# Patient Record
Sex: Male | Born: 1960 | Race: White | Hispanic: No | Marital: Married | Smoking: Never smoker
Health system: Southern US, Community
[De-identification: ages and names within clinical notes are randomized; demographics above are authoritative.]

## PROBLEM LIST (undated history)

## (undated) DIAGNOSIS — E785 Hyperlipidemia, unspecified: Secondary | ICD-10-CM

## (undated) HISTORY — PX: NO PAST SURGERIES: SHX2092

---

## 2018-06-22 ENCOUNTER — Encounter: Payer: Self-pay | Admitting: Emergency Medicine

## 2018-06-22 ENCOUNTER — Other Ambulatory Visit: Payer: Self-pay

## 2018-06-22 ENCOUNTER — Ambulatory Visit
Admission: EM | Admit: 2018-06-22 | Discharge: 2018-06-22 | Disposition: A | Discharge status: Home / Self Care | Payer: 59 | Attending: Emergency Medicine | Admitting: Emergency Medicine

## 2018-06-22 DIAGNOSIS — J101 Influenza due to other identified influenza virus with other respiratory manifestations: Secondary | ICD-10-CM | POA: Diagnosis not present

## 2018-06-22 LAB — RAPID INFLUENZA A&B ANTIGENS
Influenza A (ARMC): POSITIVE — AB
Influenza B (ARMC): NEGATIVE

## 2018-06-22 MED ORDER — BENZONATATE 200 MG PO CAPS: 200.0000 mg | ORAL_CAPSULE | Freq: Three times a day (TID) | ORAL | 0 refills | Status: DC | PRN

## 2018-06-22 MED ORDER — OSELTAMIVIR PHOSPHATE 75 MG PO CAPS: 75.0000 mg | ORAL_CAPSULE | Freq: Two times a day (BID) | ORAL | 0 refills | Status: DC

## 2018-06-22 MED ORDER — FLUTICASONE PROPIONATE 50 MCG/ACT NA SUSP: 2.0000 | Freq: Every day | NASAL | 0 refills | Status: DC

## 2018-06-22 MED ORDER — IBUPROFEN 600 MG PO TABS: 600.0000 mg | ORAL_TABLET | Freq: Four times a day (QID) | ORAL | 0 refills | Status: DC | PRN

## 2018-06-22 MED ORDER — IBUPROFEN 800 MG PO TABS
800.0000 mg | ORAL_TABLET | Freq: Once | ORAL | Status: AC
  Administered 2018-06-22: 800 mg via ORAL

## 2018-06-22 NOTE — Discharge Instructions (Addendum)
Your flu test came back positive for influenza A.  Finish the Tamiflu, ibuprofen 600 mg combined with 1 g of Tylenol 3-4 times a day as needed, Flonase, Tessalon, push fluids. Follow up With PMD as needed, to the ER if he gets worse.

## 2018-06-22 NOTE — ED Provider Notes (Signed)
HPI  SUBJECTIVE:  Chad Welch is a 58 y.o. male who presents with body aches, postnasal drip, and dry cough starting 2 days ago.  No fevers at home.  No headaches, nasal congestion, sinus pain or pressure, sore throat.  No wheezing, chest pain, shortness of breath, neck stiffness.  No rash.  No abdominal pain.  States that he is unable to sleep at night because of the cough.  He took ibuprofen 600 mg within 4 to 6 hours of evaluation.  He has also tried NyQuil and DayQuil and improvement in his symptoms.  No aggravating factors.  He got a flu shot this year.  No known contacts with the flu.  Past medical history negative for diabetes, hypertension, pulmonary disease, smoking.  PMD: Dr. Graciela Husbands in [facility].  History reviewed. No pertinent past medical history.  History reviewed. No pertinent surgical history.  Family History  Problem Relation Age of Onset  . Cancer Mother     Social History   Tobacco Use  . Smoking status: Never Smoker  . Smokeless tobacco: Never Used  Substance Use Topics  . Alcohol use: Never    Frequency: Never  . Drug use: Never    No current facility-administered medications for this encounter.   Current Outpatient Medications:  .  benzonatate (TESSALON) 200 MG capsule, Take 1 capsule (200 mg total) by mouth 3 (three) times daily as needed for cough., Disp: 30 capsule, Rfl: 0 .  fluticasone (FLONASE) 50 MCG/ACT nasal spray, Place 2 sprays into both nostrils daily., Disp: 16 g, Rfl: 0 .  ibuprofen (ADVIL,MOTRIN) 600 MG tablet, Take 1 tablet (600 mg total) by mouth every 6 (six) hours as needed., Disp: 30 tablet, Rfl: 0 .  oseltamivir (TAMIFLU) 75 MG capsule, Take 1 capsule (75 mg total) by mouth 2 (two) times daily. X 5 days, Disp: 10 capsule, Rfl: 0  No Known Allergies   ROS  As noted in HPI.   Physical Exam  BP 122/74 (BP Location: Left Arm)   Pulse 98   Temp (!) 102.4 F (39.1 C) (Oral)   Resp 16   Ht 5\' 11"  (1.803 m)   Wt 79.4 kg   SpO2 98%   BMI  24.41 kg/m   Constitutional: Well developed, well nourished, no acute distress Eyes:  EOMI, conjunctiva normal bilaterally HENT: Normocephalic, atraumatic,mucus membranes moist.  Mild nasal congestion.  No sinus tenderness. Neck: Positive shotty cervical lymphadenopathy.  No meningismus. Respiratory: Normal inspiratory effort, lungs clear bilaterally Cardiovascular: Normal rate regular rhythm no murmurs rubs or gallops GI: nondistended skin: No rash, skin intact Musculoskeletal: no deformities Neurologic: Alert & oriented x 3, no focal neuro deficits Psychiatric: Speech and behavior appropriate   ED Course   Medications  ibuprofen (ADVIL,MOTRIN) tablet 800 mg (800 mg Oral Given 06/22/18 1303)    Orders Placed This Encounter  Procedures  . Rapid Influenza A&B Antigens ([facility] only)    Standing Status:   Standing    Number of Occurrences:   1  . Droplet precaution    Standing Status:   Standing    Number of Occurrences:   1    Results for orders placed or performed during the hospital encounter of 06/22/18 (from the past 24 hour(s))  Rapid Influenza A&B Antigens ([facility] only)     Status: Abnormal   Collection Time: 06/22/18  1:02 PM  Result Value Ref Range   Influenza A ([facility]) POSITIVE (A) NEGATIVE   Influenza B ([facility]) NEGATIVE NEGATIVE   No results  found.  ED Clinical Impression  Influenza A   ED Assessment/Plan  Flu a positive.  Home with Tamiflu, ibuprofen 600 mg combined with 1 g of Tylenol 3-4 times a day as needed, Flonase, Tessalon, push fluids. Follow up With PMD as needed, to the ER if he gets worse.  Discussed labs,  MDM, treatment plan, and plan for follow-up with patient. Discussed sn/sx that should prompt return to the ED. patient agrees with plan.   Meds ordered this encounter  Medications  . ibuprofen (ADVIL,MOTRIN) tablet 800 mg  . fluticasone (FLONASE) 50 MCG/ACT nasal spray    Sig: Place 2 sprays into both nostrils daily.    Dispense:  16 g     Refill:  0  . oseltamivir (TAMIFLU) 75 MG capsule    Sig: Take 1 capsule (75 mg total) by mouth 2 (two) times daily. X 5 days    Dispense:  10 capsule    Refill:  0  . benzonatate (TESSALON) 200 MG capsule    Sig: Take 1 capsule (200 mg total) by mouth 3 (three) times daily as needed for cough.    Dispense:  30 capsule    Refill:  0  . ibuprofen (ADVIL,MOTRIN) 600 MG tablet    Sig: Take 1 tablet (600 mg total) by mouth every 6 (six) hours as needed.    Dispense:  30 tablet    Refill:  0    *This clinic note was created using Scientist, clinical (histocompatibility and immunogenetics). Therefore, there may be occasional mistakes despite careful proofreading.   ?    Domenick Gong, MD 06/22/18 1452

## 2018-06-22 NOTE — ED Triage Notes (Signed)
Patient c/o cough, congestion, and bodyaches that started on Wed.

## 2019-05-01 ENCOUNTER — Ambulatory Visit
Admission: EM | Admit: 2019-05-01 | Discharge: 2019-05-01 | Disposition: A | Discharge status: Home / Self Care | Payer: 59 | Attending: Family Medicine | Admitting: Family Medicine

## 2019-05-01 ENCOUNTER — Other Ambulatory Visit: Payer: Self-pay

## 2019-05-01 DIAGNOSIS — Z20822 Contact with and (suspected) exposure to covid-19: Secondary | ICD-10-CM

## 2019-05-01 DIAGNOSIS — Z20828 Contact with and (suspected) exposure to other viral communicable diseases: Secondary | ICD-10-CM

## 2019-05-01 DIAGNOSIS — M791 Myalgia, unspecified site: Secondary | ICD-10-CM

## 2019-05-01 DIAGNOSIS — R509 Fever, unspecified: Secondary | ICD-10-CM

## 2019-05-01 LAB — INFLUENZA PANEL BY PCR (TYPE A & B)
Influenza A By PCR: NEGATIVE
Influenza B By PCR: NEGATIVE

## 2019-05-01 NOTE — ED Triage Notes (Signed)
Patient complains of fever and body aches that started on Monday. Patient states that he was Covid tested yesterday and it hasn't come back yet. Patient states that he thinks he has the flu.

## 2019-05-01 NOTE — Discharge Instructions (Signed)
Stay home.  Rest.  Fluids.  OTC Tylenol and Ibuprofen.  Take care  Dr. Lacinda Axon

## 2019-05-01 NOTE — ED Provider Notes (Signed)
[facility]    CSN: [id number] Arrival date & time: 05/01/19  0810  History   Chief Complaint Chief Complaint  Patient presents with  . Generalized Body Aches   HPI 58 year old male presents with fever and body aches.  Symptoms started on Monday.  Patient reports that there have been a few Covid positive individuals at his work.  Patient reports that he developed fever and body aches.  Most recent fever was earlier today.  He has taken some over-the-counter antipyretics with improvement.  Temperature currently 99.1.  Also reports mild body aches, 3/10 in severity.  Patient believes that he may have influenza.  He desires testing today.  Denies respiratory symptoms.  No known exacerbating factors.  No other reported symptoms.  No other complaints.  PMH, Surgical Hx, Family Hx, Social History reviewed and updated as below.  No significant PMH.  Past Surgical History:  Procedure Laterality Date  . NO PAST SURGERIES     Home Medications    Prior to Admission medications   Medication Sig Start Date End Date Taking? Authorizing Provider  fluticasone (FLONASE) 50 MCG/ACT nasal spray Place 2 sprays into both nostrils daily. 06/22/18 05/01/19  Melynda Ripple, MD    Family History Family History  Problem Relation Age of Onset  . Cancer Mother     Social History Social History   Tobacco Use  . Smoking status: Never Smoker  . Smokeless tobacco: Never Used  Substance Use Topics  . Alcohol use: Never  . Drug use: Never     Allergies   Patient has no known allergies.   Review of Systems Review of Systems  Constitutional: Positive for fever.  Musculoskeletal:       Body aches.   Physical Exam Triage Vital Signs ED Triage Vitals  Enc Vitals Group     BP 05/01/19 0834 119/83     Pulse Rate 05/01/19 0834 95     Resp 05/01/19 0834 17     Temp 05/01/19 0834 99.1 F (37.3 C)     Temp Source 05/01/19 0834 Oral     SpO2 05/01/19 0834 99 %     Weight  05/01/19 0833 175 lb (79.4 kg)     Height 05/01/19 0833 5' 10.5" (1.791 m)     Head Circumference --      Peak Flow --      Pain Score 05/01/19 0832 3     Pain Loc --      Pain Edu? --      Excl. in [place]? --    Updated Vital Signs BP 119/83 (BP Location: Left Arm)   Pulse 95   Temp 99.1 F (37.3 C) (Oral)   Resp 17   Ht 5' 10.5" (1.791 m)   Wt 79.4 kg   SpO2 99%   BMI 24.75 kg/m   Visual Acuity Right Eye Distance:   Left Eye Distance:   Bilateral Distance:    Right Eye Near:   Left Eye Near:    Bilateral Near:     Physical Exam Vitals and nursing note reviewed.  Constitutional:      General: He is not in acute distress.    Appearance: Normal appearance. He is not ill-appearing.  HENT:     Head: Normocephalic and atraumatic.  Eyes:     General:        Right eye: No discharge.        Left eye: No discharge.     Conjunctiva/sclera: Conjunctivae normal.  Cardiovascular:     Rate and Rhythm: Normal rate and regular rhythm.     Heart sounds: No murmur.  Pulmonary:     Effort: Pulmonary effort is normal.     Breath sounds: Normal breath sounds. No wheezing, rhonchi or rales.  Neurological:     Mental Status: He is alert.  Psychiatric:        Mood and Affect: Mood normal.        Behavior: Behavior normal.    UC Treatments / Results  Labs (all labs ordered are listed, but only abnormal results are displayed) Labs Reviewed  INFLUENZA PANEL BY PCR (TYPE A & B)    EKG   Radiology No results found.  Procedures Procedures (including critical care time)  Medications Ordered in UC Medications - No data to display  Initial Impression / Assessment and Plan / UC Course  I have reviewed the triage vital signs and the nursing notes.  Pertinent labs & imaging results that were available during my care of the patient were reviewed by me and considered in my medical decision making (see chart for details).    58 year old male presents with suspected COVID-19.   Flu negative.  Advised supportive care.  Rest, fluids, Tylenol and ibuprofen.  Awaiting patient's Covid test result from drive thru testing. Work note given.  Final Clinical Impressions(s) / UC Diagnoses   Final diagnoses:  Suspected COVID-19 virus infection     Discharge Instructions     Stay home.  Rest.  Fluids.  OTC Tylenol and Ibuprofen.  Take care  Dr. Adriana Simas     ED Prescriptions    None     PDMP not reviewed this encounter.   Everlene Other [place], [place] 05/01/19 [phone]

## 2019-05-07 ENCOUNTER — Ambulatory Visit (INDEPENDENT_AMBULATORY_CARE_PROVIDER_SITE_OTHER)
Admission: EM | Admit: 2019-05-07 | Discharge: 2019-05-07 | Disposition: A | Discharge status: Other Acute Inpatient Hospital | Payer: 59 | Source: Home / Self Care | Attending: Family Medicine | Admitting: Family Medicine

## 2019-05-07 ENCOUNTER — Emergency Department: Payer: 59

## 2019-05-07 ENCOUNTER — Inpatient Hospital Stay
Admission: EM | Admit: 2019-05-07 | Discharge: 2019-05-10 | DRG: 177 | Disposition: A | Discharge status: Other Acute Inpatient Hospital | Payer: 59 | Attending: Internal Medicine | Admitting: Internal Medicine

## 2019-05-07 ENCOUNTER — Inpatient Hospital Stay: Payer: 59

## 2019-05-07 ENCOUNTER — Other Ambulatory Visit: Payer: Self-pay

## 2019-05-07 DIAGNOSIS — Z79899 Other long term (current) drug therapy: Secondary | ICD-10-CM | POA: Diagnosis not present

## 2019-05-07 DIAGNOSIS — U071 COVID-19: Principal | ICD-10-CM | POA: Diagnosis present

## 2019-05-07 DIAGNOSIS — J1282 Pneumonia due to coronavirus disease 2019: Secondary | ICD-10-CM | POA: Diagnosis present

## 2019-05-07 DIAGNOSIS — D72810 Lymphocytopenia: Secondary | ICD-10-CM | POA: Diagnosis present

## 2019-05-07 DIAGNOSIS — Z9981 Dependence on supplemental oxygen: Secondary | ICD-10-CM | POA: Diagnosis not present

## 2019-05-07 DIAGNOSIS — J9601 Acute respiratory failure with hypoxia: Secondary | ICD-10-CM | POA: Diagnosis present

## 2019-05-07 DIAGNOSIS — Z809 Family history of malignant neoplasm, unspecified: Secondary | ICD-10-CM

## 2019-05-07 DIAGNOSIS — K219 Gastro-esophageal reflux disease without esophagitis: Secondary | ICD-10-CM | POA: Diagnosis present

## 2019-05-07 LAB — COMPREHENSIVE METABOLIC PANEL
ALT: 106 U/L — ABNORMAL HIGH (ref 0–44)
AST: 74 U/L — ABNORMAL HIGH (ref 15–41)
Albumin: 3.4 g/dL — ABNORMAL LOW (ref 3.5–5.0)
Alkaline Phosphatase: 239 U/L — ABNORMAL HIGH (ref 38–126)
Anion gap: 10 (ref 5–15)
BUN: 19 mg/dL (ref 6–20)
CO2: 27 mmol/L (ref 22–32)
Calcium: 8.8 mg/dL — ABNORMAL LOW (ref 8.9–10.3)
Chloride: 98 mmol/L (ref 98–111)
Creatinine, Ser: 0.94 mg/dL (ref 0.61–1.24)
GFR calc Af Amer: >60 mL/min (ref >60)
GFR calc non Af Amer: >60 mL/min (ref >60)
Glucose, Bld: 146 mg/dL — ABNORMAL HIGH (ref 70–99)
Potassium: 4 mmol/L (ref 3.5–5.1)
Sodium: 135 mmol/L (ref 135–145)
Total Bilirubin: 1.1 mg/dL (ref 0.3–1.2)
Total Protein: 7.8 g/dL (ref 6.5–8.1)

## 2019-05-07 LAB — CBC WITH DIFFERENTIAL/PLATELET
Abs Immature Granulocytes: 0.15 10*3/uL — ABNORMAL HIGH (ref 0.00–0.07)
Basophils Absolute: 0 10*3/uL (ref 0.0–0.1)
Basophils Relative: 0 %
Eosinophils Absolute: 0 10*3/uL (ref 0.0–0.5)
Eosinophils Relative: 0 %
HCT: 40.8 % (ref 39.0–52.0)
Hemoglobin: 13.3 g/dL (ref 13.0–17.0)
Immature Granulocytes: 1 %
Lymphocytes Relative: 3 %
Lymphs Abs: 0.4 10*3/uL — ABNORMAL LOW (ref 0.7–4.0)
MCH: 29.6 pg (ref 26.0–34.0)
MCHC: 32.6 g/dL (ref 30.0–36.0)
MCV: 90.7 fL (ref 80.0–100.0)
Monocytes Absolute: 0.3 10*3/uL (ref 0.1–1.0)
Monocytes Relative: 2 %
Neutro Abs: 13.9 10*3/uL — ABNORMAL HIGH (ref 1.7–7.7)
Neutrophils Relative %: 94 %
Platelets: 200 10*3/uL (ref 150–400)
RBC: 4.5 MIL/uL (ref 4.22–5.81)
RDW: 12.4 % (ref 11.5–15.5)
WBC: 14.8 10*3/uL — ABNORMAL HIGH (ref 4.0–10.5)
nRBC: 0 % (ref 0.0–0.2)

## 2019-05-07 LAB — CBC
HCT: 40.4 % (ref 39.0–52.0)
Hemoglobin: 13.4 g/dL (ref 13.0–17.0)
MCH: 29.8 pg (ref 26.0–34.0)
MCHC: 33.2 g/dL (ref 30.0–36.0)
MCV: 90 fL (ref 80.0–100.0)
Platelets: 202 10*3/uL (ref 150–400)
RBC: 4.49 MIL/uL (ref 4.22–5.81)
RDW: 12.5 % (ref 11.5–15.5)
WBC: 15.7 10*3/uL — ABNORMAL HIGH (ref 4.0–10.5)
nRBC: 0 % (ref 0.0–0.2)

## 2019-05-07 LAB — CREATININE, SERUM
Creatinine, Ser: 0.97 mg/dL (ref 0.61–1.24)
GFR calc Af Amer: >60 mL/min (ref >60)
GFR calc non Af Amer: >60 mL/min (ref >60)

## 2019-05-07 LAB — FERRITIN: Ferritin: 679 ng/mL — ABNORMAL HIGH (ref 24–336)

## 2019-05-07 LAB — TROPONIN I (HIGH SENSITIVITY)
Troponin I (High Sensitivity): 5 ng/L (ref <18)
Troponin I (High Sensitivity): 5 ng/L (ref <18)

## 2019-05-07 LAB — PROCALCITONIN
Procalcitonin: 0.5 ng/mL
Procalcitonin: 1.06 ng/mL

## 2019-05-07 LAB — FIBRIN DERIVATIVES D-DIMER (ARMC ONLY): Fibrin derivatives D-dimer (ARMC): 937.54 ng/mL (FEU) — ABNORMAL HIGH (ref 0.00–499.00)

## 2019-05-07 LAB — HIV ANTIBODY (ROUTINE TESTING W REFLEX): HIV Screen 4th Generation wRfx: NONREACTIVE

## 2019-05-07 LAB — LACTATE DEHYDROGENASE: LDH: 276 U/L — ABNORMAL HIGH (ref 98–192)

## 2019-05-07 LAB — ABO/RH: ABO/RH(D): A NEG

## 2019-05-07 LAB — BRAIN NATRIURETIC PEPTIDE: B Natriuretic Peptide: 237 pg/mL — ABNORMAL HIGH (ref 0.0–100.0)

## 2019-05-07 LAB — LACTIC ACID, PLASMA
Lactic Acid, Venous: 2.3 mmol/L (ref 0.5–1.9)
Lactic Acid, Venous: 1.6 mmol/L (ref 0.5–1.9)

## 2019-05-07 MED ORDER — DEXAMETHASONE SODIUM PHOSPHATE 10 MG/ML IJ SOLN
8.0000 mg | Freq: Once | INTRAMUSCULAR | Status: AC
  Administered 2019-05-07: 14:00:00 8 mg via INTRAVENOUS
  Filled 2019-05-07: qty 1

## 2019-05-07 MED ORDER — ACETAMINOPHEN 500 MG PO TABS
500.0000 mg | ORAL_TABLET | Freq: Four times a day (QID) | ORAL | Status: DC | PRN
  Administered 2019-05-08: 08:00:00 500 mg via ORAL
  Administered 2019-05-08 – 2019-05-09 (×3): 1000 mg via ORAL
  Filled 2019-05-07 (×3): qty 2

## 2019-05-07 MED ORDER — SODIUM CHLORIDE 0.9 % IV SOLN
200.0000 mg | Freq: Once | INTRAVENOUS | Status: AC
  Administered 2019-05-07: 200 mg via INTRAVENOUS
  Filled 2019-05-07: qty 200

## 2019-05-07 MED ORDER — THIAMINE HCL 100 MG PO TABS
100.0000 mg | ORAL_TABLET | Freq: Every day | ORAL | Status: DC
  Administered 2019-05-07 – 2019-05-10 (×4): 100 mg via ORAL
  Filled 2019-05-07 (×4): qty 1

## 2019-05-07 MED ORDER — IOHEXOL 350 MG/ML SOLN
75.0000 mL | Freq: Once | INTRAVENOUS | Status: AC | PRN
  Administered 2019-05-07: 19:00:00 75 mL via INTRAVENOUS

## 2019-05-07 MED ORDER — DEXAMETHASONE SODIUM PHOSPHATE 10 MG/ML IJ SOLN
6.0000 mg | INTRAMUSCULAR | Status: DC
  Administered 2019-05-07 – 2019-05-09 (×3): 6 mg via INTRAVENOUS
  Filled 2019-05-07: qty 1
  Filled 2019-05-07 (×3): qty 0.6

## 2019-05-07 MED ORDER — ACETAMINOPHEN 325 MG PO TABS
650.0000 mg | ORAL_TABLET | Freq: Once | ORAL | Status: AC
  Administered 2019-05-07: 14:00:00 650 mg via ORAL
  Filled 2019-05-07: qty 2

## 2019-05-07 MED ORDER — ADULT MULTIVITAMIN W/MINERALS CH
1.0000 | ORAL_TABLET | Freq: Every day | ORAL | Status: DC
  Administered 2019-05-07 – 2019-05-10 (×4): 1 via ORAL
  Filled 2019-05-07 (×4): qty 1

## 2019-05-07 MED ORDER — ENOXAPARIN SODIUM 40 MG/0.4ML ~~LOC~~ SOLN
40.0000 mg | SUBCUTANEOUS | Status: DC
  Administered 2019-05-07 – 2019-05-09 (×3): 40 mg via SUBCUTANEOUS
  Filled 2019-05-07 (×3): qty 0.4

## 2019-05-07 MED ORDER — BUTALBITAL-APAP-CAFFEINE 50-325-40 MG PO TABS
1.0000 | ORAL_TABLET | ORAL | Status: DC | PRN
  Administered 2019-05-07 – 2019-05-10 (×3): 1 via ORAL
  Filled 2019-05-07 (×3): qty 1

## 2019-05-07 MED ORDER — OXYCODONE HCL 5 MG PO TABS: 5.0000 mg | ORAL_TABLET | ORAL | Status: DC | PRN

## 2019-05-07 MED ORDER — SENNOSIDES-DOCUSATE SODIUM 8.6-50 MG PO TABS: 1.0000 | ORAL_TABLET | Freq: Every evening | ORAL | Status: DC | PRN

## 2019-05-07 MED ORDER — ACETAMINOPHEN 325 MG PO TABS
650.0000 mg | ORAL_TABLET | Freq: Four times a day (QID) | ORAL | Status: DC | PRN
  Filled 2019-05-07: qty 2

## 2019-05-07 MED ORDER — SODIUM CHLORIDE 0.9 % IV SOLN
100.0000 mg | Freq: Every day | INTRAVENOUS | Status: DC
  Administered 2019-05-08 – 2019-05-10 (×3): 100 mg via INTRAVENOUS
  Filled 2019-05-07: qty 20
  Filled 2019-05-07 (×3): qty 100

## 2019-05-07 MED ORDER — FOLIC ACID 1 MG PO TABS
1.0000 mg | ORAL_TABLET | Freq: Every day | ORAL | Status: DC
  Administered 2019-05-07 – 2019-05-10 (×4): 1 mg via ORAL
  Filled 2019-05-07 (×4): qty 1

## 2019-05-07 NOTE — ED Notes (Signed)
Pt asking for food; no sandwich trays available at this time.  Pt provided graham crackers and peanut butter instead.

## 2019-05-07 NOTE — ED Notes (Signed)
Pt given meal tray. Pt unhooked from cardiac monitor and ambulatory to bathroom.

## 2019-05-07 NOTE — ED Provider Notes (Signed)
[facility]    CSN: [id number] Arrival date & time: 05/07/19  1048      History   Chief Complaint Chief Complaint  Patient presents with  . Cough   HPI  58 year old male who recently tested positive for Covid presents with cough and shortness of breath.  Patient reports ongoing fever and chills.  He has had some mild body aches.  Now presenting with shortness of breath.  Also reports productive cough.  Cough is worse with exhalation.  Currently afebrile at 99.3.  He has been taking Tylenol.  Patient hypoxic on arrival.  No other medications or interventions tried.  No other complaints.  PMH, Surgical Hx, Family Hx, Social History reviewed and updated as below.  No significant PMH.  Past Surgical History:  Procedure Laterality Date  . NO PAST SURGERIES       Home Medications    Prior to Admission medications   Medication Sig Start Date End Date Taking? Authorizing Provider  acetaminophen (TYLENOL) 500 MG tablet Take 500 mg by mouth every 6 (six) hours as needed.   Yes [provider]  fluticasone (FLONASE) 50 MCG/ACT nasal spray Place 2 sprays into both nostrils daily. 06/22/18 05/01/19  Domenick Gong, MD    Family History Family History  Problem Relation Age of Onset  . Cancer Mother     Social History Social History   Tobacco Use  . Smoking status: Never Smoker  . Smokeless tobacco: Never Used  Substance Use Topics  . Alcohol use: Never  . Drug use: Never     Allergies   Patient has no known allergies.   Review of Systems Review of Systems  Constitutional: Positive for chills and fever.  Respiratory: Positive for cough and shortness of breath.   Musculoskeletal:       Body aches.   Physical Exam Triage Vital Signs ED Triage Vitals  Enc Vitals Group     BP 05/07/19 1108 123/62     Pulse Rate 05/07/19 1108 94     Resp --      Temp 05/07/19 1108 99.3 F (37.4 C)     Temp Source 05/07/19 1108 Oral     SpO2 05/07/19  1108 91 %     Weight 05/07/19 1106 175 lb (79.4 kg)     Height 05/07/19 1106 5' 10.5" (1.791 m)     Head Circumference --      Peak Flow --      Pain Score 05/07/19 1105 2     Pain Loc --      Pain Edu? --      Excl. in [place]? --     Updated Vital Signs BP 123/62 (BP Location: Left Arm)   Pulse 94   Temp 99.3 F (37.4 C) (Oral)   Ht 5' 10.5" (1.791 m)   Wt 79.4 kg   SpO2 91%   BMI 24.75 kg/m   Visual Acuity Right Eye Distance:   Left Eye Distance:   Bilateral Distance:    Right Eye Near:   Left Eye Near:    Bilateral Near:     Physical Exam Constitutional:      Appearance: He is not ill-appearing or diaphoretic.     Comments: Mildly dyspneic.    HENT:     Head: Normocephalic and atraumatic.  Eyes:     General:        Right eye: No discharge.        Left eye: No discharge.  Conjunctiva/sclera: Conjunctivae normal.  Cardiovascular:     Rate and Rhythm: Regular rhythm. Tachycardia present.     Heart sounds: No murmur.  Pulmonary:     Breath sounds: No wheezing.     Comments: Mild increased work of breathing.  No appreciable adventitious breath sounds. Skin:    General: Skin is warm.     Findings: No rash.  Neurological:     Mental Status: He is alert.  Psychiatric:        Mood and Affect: Mood normal.        Behavior: Behavior normal.    UC Treatments / Results  Labs (all labs ordered are listed, but only abnormal results are displayed) Labs Reviewed - No data to display  EKG   Radiology No results found.  Procedures Procedures (including critical care time)  Medications Ordered in UC Medications - No data to display  Initial Impression / Assessment and Plan / UC Course  I have reviewed the triage vital signs and the nursing notes.  Pertinent labs & imaging results that were available during my care of the patient were reviewed by me and considered in my medical decision making (see chart for details).    58 year old male who is Covid  positive presents with hypoxia.  Placed on 2 L of oxygen.  Sending to the hospital via EMS.  Charge nurse notified.  Final Clinical Impressions(s) / UC Diagnoses   Final diagnoses:  Acute respiratory failure with hypoxia ([place])  COVID-19   Discharge Instructions   None    ED Prescriptions    None     PDMP not reviewed this encounter.   Coral Spikes, DO 05/07/19 1200

## 2019-05-07 NOTE — ED Notes (Signed)
Provider Ouma contacted about pt's continued HA and request for meds. Also notified pt would like wife called and updated by provider specifically.

## 2019-05-07 NOTE — ED Provider Notes (Signed)
[facility] Emergency Department Provider Note   ____________________________________________    I have reviewed the triage vital signs and the nursing notes.   HISTORY  Chief Complaint Shortness of Breath     HPI Chad Welch is a 58 y.o. male who presents with complaints of shortness of breath, fatigue, myalgias, chills, cough.  Patient tested positive for novel coronavirus 1 week ago.  He reports he started having body aches the day prior to being tested.  Was notified several days ago of positive test result.  Went to urgent care today because of cough and shortness of breath.  Sent to the emergency department because of hypoxia.  Has been taking Tylenol for chills and fever.  History reviewed. No pertinent past medical history.  There are no problems to display for this patient.   Past Surgical History:  Procedure Laterality Date  . NO PAST SURGERIES      Prior to Admission medications   Medication Sig Start Date End Date Taking? Authorizing Provider  acetaminophen (TYLENOL) 500 MG tablet Take 500 mg by mouth every 6 (six) hours as needed.    [provider]  fluticasone (FLONASE) 50 MCG/ACT nasal spray Place 2 sprays into both nostrils daily. 06/22/18 05/01/19  Melynda Ripple, MD     Allergies Patient has no known allergies.  Family History  Problem Relation Age of Onset  . Cancer Mother     Social History Social History   Tobacco Use  . Smoking status: Never Smoker  . Smokeless tobacco: Never Used  Substance Use Topics  . Alcohol use: Never  . Drug use: Never    Review of Systems  Constitutional: Positive chills Eyes: No visual changes.  ENT: Mild sore throat Cardiovascular: No chest pain Respiratory: As above. Gastrointestinal: No abdominal pain.  No nausea, no vomiting.   Genitourinary: Negative for dysuria. Musculoskeletal: Positive myalgias Skin: Negative for rash. Neurological: Negative for headaches      ____________________________________________   PHYSICAL EXAM:  VITAL SIGNS: ED Triage Vitals  Enc Vitals Group     BP 05/07/19 1229 (!) 141/69     Pulse Rate 05/07/19 1229 (!) 108     Resp 05/07/19 1229 18     Temp 05/07/19 1229 99.6 F (37.6 C)     Temp Source 05/07/19 1229 Oral     SpO2 05/07/19 1229 94 %     Weight 05/07/19 1230 79.4 kg (175 lb)     Height 05/07/19 1230 1.778 m (5\' 10" )     Head Circumference --      Peak Flow --      Pain Score 05/07/19 1229 3     Pain Loc --      Pain Edu? --      Excl. in [place]? --     Constitutional: Alert and oriented. Eyes: Conjunctivae are normal.  Head: Atraumatic. Nose: No congestion/rhinnorhea. Mouth/Throat: Mucous membranes are moist.   Neck:  Painless ROM Cardiovascular: Normal rate, regular rhythm.  Good peripheral circulation. Respiratory: Normal respiratory effort.  No retractions.  Musculoskeletal: No lower extremity tenderness nor edema.  Warm and well perfused Neurologic:  Normal speech and language. No gross focal neurologic deficits are appreciated.  Skin:  Skin is warm, dry and intact. No rash noted. Psychiatric: Mood and affect are normal. Speech and behavior are normal.  ____________________________________________   LABS (all labs ordered are listed, but only abnormal results are displayed)  Labs Reviewed  LACTIC ACID, PLASMA - Abnormal; Notable  for the following components:      Result Value   Lactic Acid, Venous 2.3 (*)    All other components within normal limits  CBC WITH DIFFERENTIAL/PLATELET - Abnormal; Notable for the following components:   WBC 14.8 (*)    Neutro Abs 13.9 (*)    Lymphs Abs 0.4 (*)    Abs Immature Granulocytes 0.15 (*)    All other components within normal limits  COMPREHENSIVE METABOLIC PANEL - Abnormal; Notable for the following components:   Glucose, Bld 146 (*)    Calcium 8.8 (*)    Albumin 3.4 (*)    AST 74 (*)    ALT 106 (*)    Alkaline Phosphatase 239 (*)    All  other components within normal limits  FIBRIN DERIVATIVES D-DIMER ([facility] ONLY) - Abnormal; Notable for the following components:   Fibrin derivatives D-dimer ([facility]) 937.54 (*)    All other components within normal limits  CULTURE, BLOOD (ROUTINE X 2)  CULTURE, BLOOD (ROUTINE X 2)  PROCALCITONIN  LACTIC ACID, PLASMA  BRAIN NATRIURETIC PEPTIDE  POC SARS CORONAVIRUS 2 AG -  ED  TROPONIN I (HIGH SENSITIVITY)  TROPONIN I (HIGH SENSITIVITY)   ____________________________________________  EKG  None ____________________________________________  RADIOLOGY  Chest x-ray consistent with Covid pneumonia    ____________________________________________   PROCEDURES  Procedure(s) performed: No  Procedures   Critical Care performed: yes  CRITICAL CARE Performed by: Jene Every   Total critical care time: 30 minutes  Critical care time was exclusive of separately billable procedures and treating other patients.  Critical care was necessary to treat or prevent imminent or life-threatening deterioration.  Critical care was time spent personally by me on the following activities: development of treatment plan with patient and/or surrogate as well as nursing, discussions with consultants, evaluation of patient's response to treatment, examination of patient, obtaining history from patient or surrogate, ordering and performing treatments and interventions, ordering and review of laboratory studies, ordering and review of radiographic studies, pulse oximetry and re-evaluation of patient's condition.  ____________________________________________   INITIAL IMPRESSION / ASSESSMENT AND PLAN / ED COURSE  Pertinent labs & imaging results that were available during my care of the patient were reviewed by me and considered in my medical decision making (see chart for details).  Patient presents with shortness of breath, room air saturation mid 80s, requiring 2 to 3 L nasal cannula to keep  saturations above 90%.  Approximately 7 days into Covid illness.  We will give IV Decadron, p.o. Tylenol.  He will require admission to the hospitalist service given oxygen requirement    ____________________________________________   FINAL CLINICAL IMPRESSION(S) / ED DIAGNOSES  Final diagnoses:  Acute hypoxemic respiratory failure due to COVID-19 [facility])        Note:  This document was prepared using Dragon voice recognition software and may include unintentional dictation errors.   Jene Every, MD 05/07/19 1415

## 2019-05-07 NOTE — ED Triage Notes (Signed)
Pt arrives via [facility] from [facility] urgent care for worseing [facility] after being dx with covid last Tuesday. Pt reports fever and cough. RA sats 84% on arrival,placed on 4L [place], O2 now 94%, pt has difficulty speaking in full sentences without giving out of breath

## 2019-05-07 NOTE — ED Notes (Signed)
Pt sat 86% on 2L; prompted to take deep breaths and this RN adjusted [place] as pt had it on wrong again; inc to 3L. Resp regular/unlabored currently.

## 2019-05-07 NOTE — ED Notes (Signed)
Hospitalist to bedside.

## 2019-05-07 NOTE — H&P (Signed)
History and Physical    Chad Welch QIH:[id number] DOB: [birth year] DOA: 05/07/2019  PCP: Nathanial Millman, PA  Patient coming from: Home  I have personally briefly reviewed patient's old medical records in [place]  Chief Complaint: Home  HPI: Chad Welch is a 58 y.o. male with no significant medical history who presents for evaluation fever fatigue and body aches associated with cough that started approximately a week prior to presentation.  Patient reports a several Covid positive individuals at his work.  He has been treating his fevers at home with antipyretics.  The patient initially went to urgent care where his Covid test came back positive.  He was found to be hypoxic so they sent him to the emergency department.  On presentation to the emergency department he was confirmed to be hypoxic and placed on 2 to 3 L nasal cannula with good result.  Chest x-ray was performed that demonstrated bilateral infiltrates concerning for viral pneumonia.  Patient is afebrile and remainder vital signs within normal limits.  Lactic acid was initially elevated at 2.3 and subsequently improved to 1.6.  Patient has a elevated white count of 14.8 notable neutrophilic predominance as well as lymphopenia.   ED Course: As above.  Patient was placed on nasal cannula with good improvement in his oxygen saturation.  He was given 1 dose of Decadron 6 mg IV.  Vital signs remained stable.  Internal medicine called for admission.  Review of Systems: As per HPI otherwise 10 point review of systems negative.    History reviewed. No pertinent past medical history.  Past Surgical History:  Procedure Laterality Date  . NO PAST SURGERIES       reports that he has never smoked. He has never used smokeless tobacco. He reports that he does not drink alcohol or use drugs.  No Known Allergies  Family History  Problem Relation Age of Onset  . Cancer Mother      Prior to Admission medications     Medication Sig Start Date End Date Taking? Authorizing Provider  acetaminophen (TYLENOL) 500 MG tablet Take 500-1,000 mg by mouth every 6 (six) hours as needed for moderate pain or fever.    Yes [provider]  ibuprofen (ADVIL) 200 MG tablet Take 600-800 mg by mouth every 6 (six) hours as needed for fever or moderate pain.   Yes [provider]  omeprazole (PRILOSEC) 10 MG capsule Take 10 mg by mouth daily.   Yes [provider]  terbinafine (LAMISIL) 250 MG tablet Take 250 mg by mouth daily. 04/29/19  Yes [provider]  fluticasone (FLONASE) 50 MCG/ACT nasal spray Place 2 sprays into both nostrils daily. 06/22/18 05/01/19  Melynda Ripple, MD    Physical Exam: Vitals:   05/07/19 1230 05/07/19 1330 05/07/19 1430 05/07/19 1600  BP:  124/76 113/69 120/74  Pulse:  (!) 103 (!) 104 92  Resp:  (!) 36 (!) 37 (!) 24  Temp:    99.1 F (37.3 C)  TempSrc:    Oral  SpO2:  96% 94% 91%  Weight: 79.4 kg     Height: 5\' 10"  (1.778 m)        Vitals:   05/07/19 1230 05/07/19 1330 05/07/19 1430 05/07/19 1600  BP:  124/76 113/69 120/74  Pulse:  (!) 103 (!) 104 92  Resp:  (!) 36 (!) 37 (!) 24  Temp:    99.1 F (37.3 C)  TempSrc:    Oral  SpO2:  96% 94%  91%  Weight: 79.4 kg     Height: 5\' 10"  (1.778 m)      Constitutional: NAD, calm, comfortable Eyes: PERRL, lids and conjunctivae normal ENMT: Mucous membranes are moist. Posterior pharynx clear of any exudate or lesions.Normal dentition.  Neck: normal, supple, no masses, no thyromegaly Respiratory: No accessory muscle use.  Scattered crackles bilaterally Cardiovascular: Regular rate and rhythm, no murmurs / rubs / gallops. No extremity edema. 2+ pedal pulses. No carotid bruits.  Abdomen: no tenderness, no masses palpated. No hepatosplenomegaly. Bowel sounds positive.  Musculoskeletal: no clubbing / cyanosis. No joint deformity upper and lower extremities. Good ROM, no contractures. Normal muscle tone.   Skin: no rashes, lesions, ulcers. No induration Neurologic: CN 2-12 grossly intact. Sensation intact, DTR normal. Strength 5/5 in all 4.  Psychiatric: Normal judgment and insight. Alert and oriented x 3. Normal mood.    Labs on Admission: I have personally reviewed following labs and imaging studies  CBC: Recent Labs  Lab 05/07/19 1239  WBC 14.8*  NEUTROABS 13.9*  HGB 13.3  HCT 40.8  MCV 90.7  PLT 200   Basic Metabolic Panel: Recent Labs  Lab 05/07/19 1239  NA 135  K 4.0  CL 98  CO2 27  GLUCOSE 146*  BUN 19  CREATININE 0.94  CALCIUM 8.8*   GFR: Estimated Creatinine Clearance: 89.5 mL/min (by C-G formula based on SCr of 0.94 mg/dL). Liver Function Tests: Recent Labs  Lab 05/07/19 1239  AST 74*  ALT 106*  ALKPHOS 239*  BILITOT 1.1  PROT 7.8  ALBUMIN 3.4*   No results for input(s): LIPASE, AMYLASE in the last 168 hours. No results for input(s): AMMONIA in the last 168 hours. Coagulation Profile: No results for input(s): INR, PROTIME in the last 168 hours. Cardiac Enzymes: No results for input(s): CKTOTAL, CKMB, CKMBINDEX, TROPONINI in the last 168 hours. BNP (last 3 results) No results for input(s): PROBNP in the last 8760 hours. HbA1C: No results for input(s): HGBA1C in the last 72 hours. CBG: No results for input(s): GLUCAP in the last 168 hours. Lipid Profile: No results for input(s): CHOL, HDL, LDLCALC, TRIG, CHOLHDL, LDLDIRECT in the last 72 hours. Thyroid Function Tests: No results for input(s): TSH, T4TOTAL, FREET4, T3FREE, THYROIDAB in the last 72 hours. Anemia Panel: No results for input(s): VITAMINB12, FOLATE, FERRITIN, TIBC, IRON, RETICCTPCT in the last 72 hours. Urine analysis: No results found for: COLORURINE, APPEARANCEUR, LABSPEC, PHURINE, GLUCOSEU, HGBUR, BILIRUBINUR, KETONESUR, PROTEINUR, UROBILINOGEN, NITRITE, LEUKOCYTESUR  Radiological Exams on Admission: DG Chest Port 1 View  Result Date: 05/07/2019 CLINICAL DATA:  Body aches and  shortness of breath.  COVID positive. EXAM: PORTABLE CHEST 1 VIEW COMPARISON:  None. FINDINGS: The cardiac silhouette, mediastinal and hilar contours are within normal limits. There are patchy hazy bilateral lung infiltrates consistent with COVID pneumonia. No pleural effusions or pneumothorax. The bony thorax is intact. IMPRESSION: Patchy bilateral interstitial infiltrates consistent with COVID pneumonia. Electronically Signed   By: 05/09/2019 M.D.   On: 05/07/2019 12:42    EKG: Not documented  Assessment/Plan Active Problems:   COVID-19  COVID-19 pneumonia Acute respiratory failure with hypoxia Overall patient is very stable Requiring 2 to 4 L of oxygen Plan: Admit inpatient COVID-19 isolation precautions Initiate remdesivir, pharmacy to dose Dexamethasone 6 mg daily Wean down oxygen as tolerated Supplemental measures, incentive spirometry, flutter valve Check CT chest PE protocol rule out pulmonary embolism  GERD Daily protonix  DVT prophylaxis: Lovenox Code Status: Full Family Communication: None Disposition Plan: Home,  2 to 4 days Consults called: None Admission status: Inpatient   Tresa MooreSudheer B Linell Meldrum MD [facility] Pager [phone]  If 7PM-7AM, please contact night-coverage [email or web address] Password [email or web address]  05/07/2019, 5:12 PM

## 2019-05-07 NOTE — ED Triage Notes (Signed)
Pt presents with c/o POS COVID test. He was tested this past Tuesday. He reports he started with mild body aches the day prior. He does state multiple coworkers have tested positive, however he does not work directly with any of them. He currently reports pain in his throat when he takes a deep breath and also a dry cough. He denies shob, productive cough. He does report some fever and chills, he has treated these with Tylenol.

## 2019-05-07 NOTE — ED Notes (Signed)
Patient transported to CT 

## 2019-05-07 NOTE — Consult Note (Signed)
Remdesivir - Pharmacy Brief Note   Patient has reported positive COVID test from Urgent Care  O:  ALT: 106 CXR: Patchy bilateral interstitial infiltrates consistent with COVID pneumonia. SpO2: 91% on room air   A/P:  Remdesivir 200 mg IVPB once followed by 100 mg IVPB daily x 4 days.   Pernell Dupre, PharmD, BCPS Clinical Pharmacist 05/07/2019 5:52 PM

## 2019-05-07 NOTE — ED Notes (Signed)
Pt requesting meds for a migraine headache. States was given tylenol earlier but it hasn't helped. Will notify attending/rounding doc.

## 2019-05-07 NOTE — ED Notes (Signed)
Pt ambulatory to bathroom independently

## 2019-05-08 LAB — CBC WITH DIFFERENTIAL/PLATELET
Abs Immature Granulocytes: 0.21 10*3/uL — ABNORMAL HIGH (ref 0.00–0.07)
Basophils Absolute: 0 10*3/uL (ref 0.0–0.1)
Basophils Relative: 0 %
Eosinophils Absolute: 0 10*3/uL (ref 0.0–0.5)
Eosinophils Relative: 0 %
HCT: 37.5 % — ABNORMAL LOW (ref 39.0–52.0)
Hemoglobin: 12.8 g/dL — ABNORMAL LOW (ref 13.0–17.0)
Immature Granulocytes: 1 %
Lymphocytes Relative: 3 %
Lymphs Abs: 0.5 10*3/uL — ABNORMAL LOW (ref 0.7–4.0)
MCH: 29.6 pg (ref 26.0–34.0)
MCHC: 34.1 g/dL (ref 30.0–36.0)
MCV: 86.6 fL (ref 80.0–100.0)
Monocytes Absolute: 0.3 10*3/uL (ref 0.1–1.0)
Monocytes Relative: 2 %
Neutro Abs: 13.9 10*3/uL — ABNORMAL HIGH (ref 1.7–7.7)
Neutrophils Relative %: 94 %
Platelets: 216 10*3/uL (ref 150–400)
RBC: 4.33 MIL/uL (ref 4.22–5.81)
RDW: 12.5 % (ref 11.5–15.5)
WBC: 14.9 10*3/uL — ABNORMAL HIGH (ref 4.0–10.5)
nRBC: 0 % (ref 0.0–0.2)

## 2019-05-08 LAB — COMPREHENSIVE METABOLIC PANEL
ALT: 76 U/L — ABNORMAL HIGH (ref 0–44)
AST: 44 U/L — ABNORMAL HIGH (ref 15–41)
Albumin: 3.1 g/dL — ABNORMAL LOW (ref 3.5–5.0)
Alkaline Phosphatase: 232 U/L — ABNORMAL HIGH (ref 38–126)
Anion gap: 10 (ref 5–15)
BUN: 19 mg/dL (ref 6–20)
CO2: 26 mmol/L (ref 22–32)
Calcium: 8.9 mg/dL (ref 8.9–10.3)
Chloride: 100 mmol/L (ref 98–111)
Creatinine, Ser: 0.93 mg/dL (ref 0.61–1.24)
GFR calc Af Amer: >60 mL/min (ref >60)
GFR calc non Af Amer: >60 mL/min (ref >60)
Glucose, Bld: 162 mg/dL — ABNORMAL HIGH (ref 70–99)
Potassium: 4 mmol/L (ref 3.5–5.1)
Sodium: 136 mmol/L (ref 135–145)
Total Bilirubin: 0.7 mg/dL (ref 0.3–1.2)
Total Protein: 7.1 g/dL (ref 6.5–8.1)

## 2019-05-08 NOTE — Progress Notes (Signed)
PROGRESS NOTE    Chad Welch  NOM:[id number] DOB: 02-02-1961 DOA: 05/07/2019 PCP: Nathanial Millman, PA   Brief Narrative:  Chad Welch is a 58 y.o. male with no significant medical history who presents for evaluation fever fatigue and body aches associated with cough that started approximately a week prior to presentation.  Patient reports a several Covid positive individuals at his work.  He has been treating his fevers at home with antipyretics.  The patient initially went to urgent care where his Covid test came back positive.  He was found to be hypoxic so they sent him to the emergency department.  On presentation to the emergency department he was confirmed to be hypoxic and placed on 2 to 3 L nasal cannula with good result.  Chest x-ray was performed that demonstrated bilateral infiltrates concerning for viral pneumonia.  Patient is afebrile and remainder vital signs within normal limits.  Lactic acid was initially elevated at 2.3 and subsequently improved to 1.6.  Patient has a elevated white count of 14.8 notable neutrophilic predominance as well as lymphopenia.  12/30: Patient doing well overall.  CT chest negative for pulmonary embolism.  Radiographic evidence of bilateral consistent with lobar pneumonia.  Weaned off supplemental oxygen this morning.  On room air.   Assessment & Plan:   Active Problems:   COVID-19   COVID-19 pneumonia Acute respiratory failure with hypoxia Oxygen requirements improving Patient stated Plan: COVID-19 isolation precautions Continue remdesivir, day 2 of 5 Dexamethasone 6 mg daily, due to 10 Wean down oxygen as tolerated Supplemental measures, incentive spirometry, flutter valve   GERD Daily protonix   DVT prophylaxis: Lovenox Code Status: Full Family Communication: Wife Jenny Reichmann via phone [phone] Disposition Plan: Home, anticipate 24 to 48 hours  Consultants:   None  Procedures: None Antimicrobials through remdesivir  (05/07/19-  )  Subjective: Seen and examined No acute events Weaned off supplemental oxygen Feels well, no new complaints  Objective: Vitals:   05/08/19 0030 05/08/19 0100 05/08/19 0126 05/08/19 1237  BP: 115/73 115/71 117/74 113/73  Pulse: 81 80 91 87  Resp: (!) 23 (!) 25 18 20   Temp:   98.1 F (36.7 C) 98 F (36.7 C)  TempSrc:   Oral Oral  SpO2: 92% 90% 94% 92%  Weight:      Height:        Intake/Output Summary (Last 24 hours) at 05/08/2019 1337 Last data filed at 05/08/2019 1103 Gross per 24 hour  Intake --  Output 600 ml  Net -600 ml   Filed Weights   05/07/19 1230  Weight: 79.4 kg    Examination:  General exam: Appears calm and comfortable  Respiratory system: Scattered crackles Cardiovascular system: S1 & S2 heard, RRR. No JVD, murmurs, rubs, gallops or clicks. No pedal edema. Gastrointestinal system: Abdomen is nondistended, soft and nontender. No organomegaly or masses felt. Normal bowel sounds heard. Central nervous system: Alert and oriented. No focal neurological deficits. Extremities: Symmetric 5 x 5 power. Skin: No rashes, lesions or ulcers Psychiatry: Judgement and insight appear normal. Mood & affect appropriate.     Data Reviewed: I have personally reviewed following labs and imaging studies  CBC: Recent Labs  Lab 05/07/19 1239 05/07/19 1937 05/08/19 0449  WBC 14.8* 15.7* 14.9*  NEUTROABS 13.9*  --  13.9*  HGB 13.3 13.4 12.8*  HCT 40.8 40.4 37.5*  MCV 90.7 90.0 86.6  PLT 200 202 765   Basic Metabolic Panel: Recent Labs  Lab 05/07/19 1239 05/07/19 1937 05/08/19  0449  NA 135  --  136  K 4.0  --  4.0  CL 98  --  100  CO2 27  --  26  GLUCOSE 146*  --  162*  BUN 19  --  19  CREATININE 0.94 0.97 0.93  CALCIUM 8.8*  --  8.9   GFR: Estimated Creatinine Clearance: 90.5 mL/min (by C-G formula based on SCr of 0.93 mg/dL). Liver Function Tests: Recent Labs  Lab 05/07/19 1239 05/08/19 0449  AST 74* 44*  ALT 106* 76*  ALKPHOS  239* 232*  BILITOT 1.1 0.7  PROT 7.8 7.1  ALBUMIN 3.4* 3.1*   No results for input(s): LIPASE, AMYLASE in the last 168 hours. No results for input(s): AMMONIA in the last 168 hours. Coagulation Profile: No results for input(s): INR, PROTIME in the last 168 hours. Cardiac Enzymes: No results for input(s): CKTOTAL, CKMB, CKMBINDEX, TROPONINI in the last 168 hours. BNP (last 3 results) No results for input(s): PROBNP in the last 8760 hours. HbA1C: No results for input(s): HGBA1C in the last 72 hours. CBG: No results for input(s): GLUCAP in the last 168 hours. Lipid Profile: No results for input(s): CHOL, HDL, LDLCALC, TRIG, CHOLHDL, LDLDIRECT in the last 72 hours. Thyroid Function Tests: No results for input(s): TSH, T4TOTAL, FREET4, T3FREE, THYROIDAB in the last 72 hours. Anemia Panel: Recent Labs    05/07/19 1937  FERRITIN 679*   Sepsis Labs: Recent Labs  Lab 05/07/19 1221 05/07/19 1239 05/07/19 1452 05/07/19 1937  PROCALCITON  --  0.50  --  1.06  LATICACIDVEN 2.3*  --  1.6  --     No results found for this or any previous visit (from the past 240 hour(s)).       Radiology Studies: CT ANGIO CHEST PE W OR WO CONTRAST  Result Date: 05/07/2019 CLINICAL DATA:  Shortness of breath.  COVID positive. EXAM: CT ANGIOGRAPHY CHEST WITH CONTRAST TECHNIQUE: Multidetector CT imaging of the chest was performed using the standard protocol during bolus administration of intravenous contrast. Multiplanar CT image reconstructions and MIPs were obtained to evaluate the vascular anatomy. CONTRAST:  44mL OMNIPAQUE IOHEXOL 350 MG/ML SOLN COMPARISON:  Chest x-ray from same day. FINDINGS: Cardiovascular: Satisfactory opacification of the pulmonary arteries to the segmental level. No evidence of pulmonary embolism. Normal heart size. No pericardial effusion. No thoracic aortic aneurysm or dissection. Mediastinum/Nodes: Enlarged mediastinal and bilateral hilar lymph nodes measuring up to 1.7 cm.  No enlarged axillary lymph nodes. The thyroid gland, trachea, and esophagus demonstrate no significant findings. Lungs/Pleura: Diffuse patchy ground-glass density with inter- and intralobular septal thickening throughout both lungs, with more consolidative opacity in both lower lobes. No pleural effusion or pneumothorax. Upper Abdomen: No acute abnormality. Musculoskeletal: No chest wall abnormality. No acute or significant osseous findings. Review of the MIP images confirms the above findings. IMPRESSION: 1.  No evidence of pulmonary embolism. 2. Findings consistent with extensive multifocal pneumonia, with appearance typical of COVID-19 pneumonia. 3. Mediastinal and bilateral hilar lymphadenopathy, presumably reactive, although this is uncommon in COVID-19 infection. Consider follow-up chest CT in 3 months to evaluate for interval change. Electronically Signed   By: Obie Dredge M.D.   On: 05/07/2019 19:16   DG Chest Port 1 View  Result Date: 05/07/2019 CLINICAL DATA:  Body aches and shortness of breath.  COVID positive. EXAM: PORTABLE CHEST 1 VIEW COMPARISON:  None. FINDINGS: The cardiac silhouette, mediastinal and hilar contours are within normal limits. There are patchy hazy bilateral lung infiltrates consistent with  COVID pneumonia. No pleural effusions or pneumothorax. The bony thorax is intact. IMPRESSION: Patchy bilateral interstitial infiltrates consistent with COVID pneumonia. Electronically Signed   By: Rudie MeyerP.  Gallerani M.D.   On: 05/07/2019 12:42        Scheduled Meds: . dexamethasone (DECADRON) injection  6 mg Intravenous Q24H  . enoxaparin (LOVENOX) injection  40 mg Subcutaneous Q24H  . folic acid  1 mg Oral Daily  . multivitamin with minerals  1 tablet Oral Daily  . thiamine  100 mg Oral Daily   Continuous Infusions: . remdesivir 100 mg in NS 100 mL 100 mg (05/08/19 0839)     LOS: 1 day    Time spent: 35 minutes    Tresa MooreSudheer B Amariyah Bazar, MD [facility] Pager  [phone]  If 7PM-7AM, please contact night-coverage [email or web address] Password [email or web address] 05/08/2019, 1:37 PM

## 2019-05-08 NOTE — ED Notes (Signed)
Attempted to call report at this time 

## 2019-05-08 NOTE — Progress Notes (Signed)
On 2L of oxygen. Oxygen saturations staying at 92%

## 2019-05-09 LAB — CBC WITH DIFFERENTIAL/PLATELET
Abs Immature Granulocytes: 0.49 10*3/uL — ABNORMAL HIGH (ref 0.00–0.07)
Basophils Absolute: 0 10*3/uL (ref 0.0–0.1)
Basophils Relative: 0 %
Eosinophils Absolute: 0 10*3/uL (ref 0.0–0.5)
Eosinophils Relative: 0 %
HCT: 42.7 % (ref 39.0–52.0)
Hemoglobin: 13.9 g/dL (ref 13.0–17.0)
Immature Granulocytes: 3 %
Lymphocytes Relative: 3 %
Lymphs Abs: 0.6 10*3/uL — ABNORMAL LOW (ref 0.7–4.0)
MCH: 29.5 pg (ref 26.0–34.0)
MCHC: 32.6 g/dL (ref 30.0–36.0)
MCV: 90.7 fL (ref 80.0–100.0)
Monocytes Absolute: 0.6 10*3/uL (ref 0.1–1.0)
Monocytes Relative: 3 %
Neutro Abs: 16.3 10*3/uL — ABNORMAL HIGH (ref 1.7–7.7)
Neutrophils Relative %: 91 %
Platelets: 306 10*3/uL (ref 150–400)
RBC: 4.71 MIL/uL (ref 4.22–5.81)
RDW: 12.6 % (ref 11.5–15.5)
WBC: 18.1 10*3/uL — ABNORMAL HIGH (ref 4.0–10.5)
nRBC: 0 % (ref 0.0–0.2)

## 2019-05-09 LAB — COMPREHENSIVE METABOLIC PANEL
ALT: 63 U/L — ABNORMAL HIGH (ref 0–44)
AST: 41 U/L (ref 15–41)
Albumin: 3.1 g/dL — ABNORMAL LOW (ref 3.5–5.0)
Alkaline Phosphatase: 191 U/L — ABNORMAL HIGH (ref 38–126)
Anion gap: 9 (ref 5–15)
BUN: 20 mg/dL (ref 6–20)
CO2: 28 mmol/L (ref 22–32)
Calcium: 8.8 mg/dL — ABNORMAL LOW (ref 8.9–10.3)
Chloride: 103 mmol/L (ref 98–111)
Creatinine, Ser: 0.83 mg/dL (ref 0.61–1.24)
GFR calc Af Amer: >60 mL/min (ref >60)
GFR calc non Af Amer: >60 mL/min (ref >60)
Glucose, Bld: 150 mg/dL — ABNORMAL HIGH (ref 70–99)
Potassium: 4.4 mmol/L (ref 3.5–5.1)
Sodium: 140 mmol/L (ref 135–145)
Total Bilirubin: 0.6 mg/dL (ref 0.3–1.2)
Total Protein: 7.4 g/dL (ref 6.5–8.1)

## 2019-05-09 MED ORDER — PANTOPRAZOLE SODIUM 40 MG PO TBEC
40.0000 mg | DELAYED_RELEASE_TABLET | Freq: Every day | ORAL | Status: DC
  Administered 2019-05-09 – 2019-05-10 (×2): 40 mg via ORAL
  Filled 2019-05-09 (×2): qty 1

## 2019-05-09 MED ORDER — SODIUM CHLORIDE 0.9 % IV SOLN
INTRAVENOUS | Status: DC | PRN
  Administered 2019-05-09: 08:00:00 10 mL via INTRAVENOUS

## 2019-05-09 MED ORDER — ALUM & MAG HYDROXIDE-SIMETH 200-200-20 MG/5ML PO SUSP
15.0000 mL | ORAL | Status: DC | PRN
  Administered 2019-05-09: 18:00:00 15 mL via ORAL
  Filled 2019-05-09: qty 30

## 2019-05-09 NOTE — Progress Notes (Signed)
PROGRESS NOTE    Chad Welch  [id number]:[id number] DOB: 08/18/1960 DOA: 05/07/2019 PCP: Leslye PeerWeeks, Jonathan William, PA   Brief Narrative:  Chad Welch is a 58 y.o. male with no significant medical history who presents for evaluation fever fatigue and body aches associated with cough that started approximately a week prior to presentation.  Patient reports a several Covid positive individuals at his work.  He has been treating his fevers at home with antipyretics.  The patient initially went to urgent care where his Covid test came back positive.  He was found to be hypoxic so they sent him to the emergency department.  On presentation to the emergency department he was confirmed to be hypoxic and placed on 2 to 3 L nasal cannula with good result.  Chest x-ray was performed that demonstrated bilateral infiltrates concerning for viral pneumonia.  Patient is afebrile and remainder vital signs within normal limits.  Lactic acid was initially elevated at 2.3 and subsequently improved to 1.6.  Patient has a elevated white count of 14.8 notable neutrophilic predominance as well as lymphopenia.  12/30: Patient doing well overall.  CT chest negative for pulmonary embolism.  Radiographic evidence of bilateral consistent with lobar pneumonia.  Weaned off supplemental oxygen this morning.  On room air.  12/31: Patient remains on 2 to 3 L nasal cannula.  Drop saturations yesterday during the day and required supplemental oxygen.  Overall feels well.   Assessment & Plan:   Active Problems:   COVID-19   COVID-19 pneumonia Acute respiratory failure with hypoxia Oxygen requirements improving Patient stated Plan: COVID-19 isolation precautions Continue remdesivir, day 3 of 5 Dexamethasone 6 mg daily, day 3 of 10 Wean down oxygen as tolerated Supplemental measures, incentive spirometry, flutter valve   GERD Daily protonix   DVT prophylaxis: Lovenox Code Status: Full Family Communication: Wife Arline AspCindy via  phone [phone] om 12/30 Disposition Plan: Home, anticipate 24 to 48 hours  Consultants:   None  Procedures: None Antimicrobials through remdesivir (05/07/19-  )  Subjective: Seen and examined No acute events Requiring 2 to 3 L of nasal oxygen No new complaints  Objective: Vitals:   05/08/19 1957 05/09/19 0456 05/09/19 0820 05/09/19 0821  BP: 119/74 130/87  122/79  Pulse: 91 87  85  Resp: 18 18  18   Temp: 98.1 F (36.7 C) 98.6 F (37 C)  98.5 F (36.9 C)  TempSrc: Oral Oral  Oral  SpO2: 91% 94% (!) 87% 92%  Weight:      Height:        Intake/Output Summary (Last 24 hours) at 05/09/2019 1123 Last data filed at 05/09/2019 0636 Gross per 24 hour  Intake 340 ml  Output 0 ml  Net 340 ml   Filed Weights   05/07/19 1230  Weight: 79.4 kg    Examination:  General exam: Appears calm and comfortable  Respiratory system: Scattered crackles Cardiovascular system: S1 & S2 heard, RRR. No JVD, murmurs, rubs, gallops or clicks. No pedal edema. Gastrointestinal system: Abdomen is nondistended, soft and nontender. No organomegaly or masses felt. Normal bowel sounds heard. Central nervous system: Alert and oriented. No focal neurological deficits. Extremities: Symmetric 5 x 5 power. Skin: No rashes, lesions or ulcers Psychiatry: Judgement and insight appear normal. Mood & affect appropriate.     Data Reviewed: I have personally reviewed following labs and imaging studies  CBC: Recent Labs  Lab 05/07/19 1239 05/07/19 1937 05/08/19 0449 05/09/19 0643  WBC 14.8* 15.7* 14.9* 18.1*  NEUTROABS 13.9*  --  13.9* 16.3*  HGB 13.3 13.4 12.8* 13.9  HCT 40.8 40.4 37.5* 42.7  MCV 90.7 90.0 86.6 90.7  PLT 200 202 216 485   Basic Metabolic Panel: Recent Labs  Lab 05/07/19 1239 05/07/19 1937 05/08/19 0449 05/09/19 0643  NA 135  --  136 140  K 4.0  --  4.0 4.4  CL 98  --  100 103  CO2 27  --  26 28  GLUCOSE 146*  --  162* 150*  BUN 19  --  19 20  CREATININE 0.94  0.97 0.93 0.83  CALCIUM 8.8*  --  8.9 8.8*   GFR: Estimated Creatinine Clearance: 101.4 mL/min (by C-G formula based on SCr of 0.83 mg/dL). Liver Function Tests: Recent Labs  Lab 05/07/19 1239 05/08/19 0449 05/09/19 0643  AST 74* 44* 41  ALT 106* 76* 63*  ALKPHOS 239* 232* 191*  BILITOT 1.1 0.7 0.6  PROT 7.8 7.1 7.4  ALBUMIN 3.4* 3.1* 3.1*   No results for input(s): LIPASE, AMYLASE in the last 168 hours. No results for input(s): AMMONIA in the last 168 hours. Coagulation Profile: No results for input(s): INR, PROTIME in the last 168 hours. Cardiac Enzymes: No results for input(s): CKTOTAL, CKMB, CKMBINDEX, TROPONINI in the last 168 hours. BNP (last 3 results) No results for input(s): PROBNP in the last 8760 hours. HbA1C: No results for input(s): HGBA1C in the last 72 hours. CBG: No results for input(s): GLUCAP in the last 168 hours. Lipid Profile: No results for input(s): CHOL, HDL, LDLCALC, TRIG, CHOLHDL, LDLDIRECT in the last 72 hours. Thyroid Function Tests: No results for input(s): TSH, T4TOTAL, FREET4, T3FREE, THYROIDAB in the last 72 hours. Anemia Panel: Recent Labs    05/07/19 1937  FERRITIN 679*   Sepsis Labs: Recent Labs  Lab 05/07/19 1221 05/07/19 1239 05/07/19 1452 05/07/19 1937  PROCALCITON  --  0.50  --  1.06  LATICACIDVEN 2.3*  --  1.6  --     Recent Results (from the past 240 hour(s))  Blood Culture (routine x 2)     Status: None (Preliminary result)   Collection Time: 05/07/19  2:52 PM   Specimen: BLOOD  Result Value Ref Range Status   Specimen Description BLOOD RIGHT ANTECUBITAL  Final   Special Requests   Final    BOTTLES DRAWN AEROBIC AND ANAEROBIC Blood Culture adequate volume   Culture   Final    NO GROWTH 2 DAYS Performed at [facility], [address] [place], [place] [address]    Report Status PENDING  Incomplete  Blood Culture (routine x 2)     Status: None (Preliminary result)   Collection Time: 05/07/19  2:52 PM     Specimen: BLOOD  Result Value Ref Range Status   Specimen Description BLOOD BLOOD RIGHT HAND  Final   Special Requests   Final    BOTTLES DRAWN AEROBIC AND ANAEROBIC Blood Culture results may not be optimal due to an excessive volume of blood received in culture bottles   Culture   Final    NO GROWTH 2 DAYS Performed at [facility], [address] [place], [place] [address]    Report Status PENDING  Incomplete         Radiology Studies: CT ANGIO CHEST PE W OR WO CONTRAST  Result Date: 05/07/2019 CLINICAL DATA:  Shortness of breath.  COVID positive. EXAM: CT ANGIOGRAPHY CHEST WITH CONTRAST TECHNIQUE: Multidetector CT imaging of the chest was performed using the standard protocol during bolus  administration of intravenous contrast. Multiplanar CT image reconstructions and MIPs were obtained to evaluate the vascular anatomy. CONTRAST:  38mL OMNIPAQUE IOHEXOL 350 MG/ML SOLN COMPARISON:  Chest x-ray from same day. FINDINGS: Cardiovascular: Satisfactory opacification of the pulmonary arteries to the segmental level. No evidence of pulmonary embolism. Normal heart size. No pericardial effusion. No thoracic aortic aneurysm or dissection. Mediastinum/Nodes: Enlarged mediastinal and bilateral hilar lymph nodes measuring up to 1.7 cm. No enlarged axillary lymph nodes. The thyroid gland, trachea, and esophagus demonstrate no significant findings. Lungs/Pleura: Diffuse patchy ground-glass density with inter- and intralobular septal thickening throughout both lungs, with more consolidative opacity in both lower lobes. No pleural effusion or pneumothorax. Upper Abdomen: No acute abnormality. Musculoskeletal: No chest wall abnormality. No acute or significant osseous findings. Review of the MIP images confirms the above findings. IMPRESSION: 1.  No evidence of pulmonary embolism. 2. Findings consistent with extensive multifocal pneumonia, with appearance typical of COVID-19 pneumonia. 3.  Mediastinal and bilateral hilar lymphadenopathy, presumably reactive, although this is uncommon in COVID-19 infection. Consider follow-up chest CT in 3 months to evaluate for interval change. Electronically Signed   By: Obie Dredge M.D.   On: 05/07/2019 19:16   DG Chest Port 1 View  Result Date: 05/07/2019 CLINICAL DATA:  Body aches and shortness of breath.  COVID positive. EXAM: PORTABLE CHEST 1 VIEW COMPARISON:  None. FINDINGS: The cardiac silhouette, mediastinal and hilar contours are within normal limits. There are patchy hazy bilateral lung infiltrates consistent with COVID pneumonia. No pleural effusions or pneumothorax. The bony thorax is intact. IMPRESSION: Patchy bilateral interstitial infiltrates consistent with COVID pneumonia. Electronically Signed   By: Rudie Meyer M.D.   On: 05/07/2019 12:42        Scheduled Meds: . dexamethasone (DECADRON) injection  6 mg Intravenous Q24H  . enoxaparin (LOVENOX) injection  40 mg Subcutaneous Q24H  . folic acid  1 mg Oral Daily  . multivitamin with minerals  1 tablet Oral Daily  . pantoprazole  40 mg Oral Daily  . thiamine  100 mg Oral Daily   Continuous Infusions: . sodium chloride 10 mL (05/09/19 0827)  . remdesivir 100 mg in NS 100 mL 100 mg (05/09/19 0830)     LOS: 2 days    Time spent: 35 minutes    Tresa Moore, MD [facility] Pager [phone]  If 7PM-7AM, please contact night-coverage [email or web address] Password [email or web address] 05/09/2019, 11:23 AM

## 2019-05-10 ENCOUNTER — Other Ambulatory Visit: Payer: Self-pay

## 2019-05-10 ENCOUNTER — Inpatient Hospital Stay (HOSPITAL_COMMUNITY)
Admission: AD | Admit: 2019-05-10 | Discharge: 2019-05-13 | DRG: 177 | Disposition: A | Discharge status: Home / Self Care | Payer: 59 | Source: Other Acute Inpatient Hospital | Attending: Internal Medicine | Admitting: Internal Medicine

## 2019-05-10 ENCOUNTER — Encounter (HOSPITAL_COMMUNITY): Payer: Self-pay | Admitting: Internal Medicine

## 2019-05-10 DIAGNOSIS — J159 Unspecified bacterial pneumonia: Secondary | ICD-10-CM | POA: Diagnosis present

## 2019-05-10 DIAGNOSIS — J1282 Pneumonia due to coronavirus disease 2019: Secondary | ICD-10-CM

## 2019-05-10 DIAGNOSIS — R590 Localized enlarged lymph nodes: Secondary | ICD-10-CM | POA: Diagnosis present

## 2019-05-10 DIAGNOSIS — J9601 Acute respiratory failure with hypoxia: Secondary | ICD-10-CM | POA: Diagnosis present

## 2019-05-10 DIAGNOSIS — K219 Gastro-esophageal reflux disease without esophagitis: Secondary | ICD-10-CM | POA: Diagnosis present

## 2019-05-10 DIAGNOSIS — U071 COVID-19: Principal | ICD-10-CM

## 2019-05-10 DIAGNOSIS — R7401 Elevation of levels of liver transaminase levels: Secondary | ICD-10-CM | POA: Diagnosis present

## 2019-05-10 LAB — RESPIRATORY PANEL BY RT PCR (FLU A&B, COVID)
Influenza A by PCR: NEGATIVE
Influenza B by PCR: NEGATIVE
SARS Coronavirus 2 by RT PCR: POSITIVE — AB

## 2019-05-10 LAB — CBC WITH DIFFERENTIAL/PLATELET
Abs Immature Granulocytes: 0.57 10*3/uL — ABNORMAL HIGH (ref 0.00–0.07)
Basophils Absolute: 0.1 10*3/uL (ref 0.0–0.1)
Basophils Relative: 0 %
Eosinophils Absolute: 0 10*3/uL (ref 0.0–0.5)
Eosinophils Relative: 0 %
HCT: 42.2 % (ref 39.0–52.0)
Hemoglobin: 13.8 g/dL (ref 13.0–17.0)
Immature Granulocytes: 4 %
Lymphocytes Relative: 6 %
Lymphs Abs: 0.9 10*3/uL (ref 0.7–4.0)
MCH: 29.3 pg (ref 26.0–34.0)
MCHC: 32.7 g/dL (ref 30.0–36.0)
MCV: 89.6 fL (ref 80.0–100.0)
Monocytes Absolute: 1 10*3/uL (ref 0.1–1.0)
Monocytes Relative: 6 %
Neutro Abs: 12.8 10*3/uL — ABNORMAL HIGH (ref 1.7–7.7)
Neutrophils Relative %: 84 %
Platelets: 347 10*3/uL (ref 150–400)
RBC: 4.71 MIL/uL (ref 4.22–5.81)
RDW: 12.7 % (ref 11.5–15.5)
WBC: 15.3 10*3/uL — ABNORMAL HIGH (ref 4.0–10.5)
nRBC: 0 % (ref 0.0–0.2)

## 2019-05-10 LAB — COMPREHENSIVE METABOLIC PANEL
ALT: 79 U/L — ABNORMAL HIGH (ref 0–44)
AST: 59 U/L — ABNORMAL HIGH (ref 15–41)
Albumin: 2.9 g/dL — ABNORMAL LOW (ref 3.5–5.0)
Alkaline Phosphatase: 206 U/L — ABNORMAL HIGH (ref 38–126)
Anion gap: 11 (ref 5–15)
BUN: 20 mg/dL (ref 6–20)
CO2: 26 mmol/L (ref 22–32)
Calcium: 8.8 mg/dL — ABNORMAL LOW (ref 8.9–10.3)
Chloride: 103 mmol/L (ref 98–111)
Creatinine, Ser: 0.82 mg/dL (ref 0.61–1.24)
GFR calc Af Amer: >60 mL/min (ref >60)
GFR calc non Af Amer: >60 mL/min (ref >60)
Glucose, Bld: 131 mg/dL — ABNORMAL HIGH (ref 70–99)
Potassium: 4.2 mmol/L (ref 3.5–5.1)
Sodium: 140 mmol/L (ref 135–145)
Total Bilirubin: 0.6 mg/dL (ref 0.3–1.2)
Total Protein: 7.1 g/dL (ref 6.5–8.1)

## 2019-05-10 LAB — PROCALCITONIN: Procalcitonin: 0.39 ng/mL

## 2019-05-10 LAB — FIBRIN DERIVATIVES D-DIMER (ARMC ONLY): Fibrin derivatives D-dimer (ARMC): 1090.44 ng/mL (FEU) — ABNORMAL HIGH (ref 0.00–499.00)

## 2019-05-10 LAB — LACTIC ACID, PLASMA
Lactic Acid, Venous: 2.4 mmol/L (ref 0.5–1.9)
Lactic Acid, Venous: 2 mmol/L (ref 0.5–1.9)

## 2019-05-10 LAB — MRSA PCR SCREENING: MRSA by PCR: NEGATIVE

## 2019-05-10 MED ORDER — ENOXAPARIN SODIUM 40 MG/0.4ML ~~LOC~~ SOLN
40.0000 mg | SUBCUTANEOUS | Status: DC
  Administered 2019-05-10 – 2019-05-12 (×3): 40 mg via SUBCUTANEOUS
  Filled 2019-05-10 (×3): qty 0.4

## 2019-05-10 MED ORDER — SODIUM CHLORIDE 0.9 % IV SOLN: 1.0000 g | INTRAVENOUS | Status: DC

## 2019-05-10 MED ORDER — ONDANSETRON HCL 4 MG PO TABS: 4.0000 mg | ORAL_TABLET | Freq: Four times a day (QID) | ORAL | Status: DC | PRN

## 2019-05-10 MED ORDER — SODIUM CHLORIDE 0.9 % IV SOLN
100.0000 mg | Freq: Every day | INTRAVENOUS | Status: AC
  Administered 2019-05-11: 100 mg via INTRAVENOUS
  Filled 2019-05-10: qty 20

## 2019-05-10 MED ORDER — SODIUM CHLORIDE 0.9 % IV SOLN
500.0000 mg | INTRAVENOUS | Status: DC
  Administered 2019-05-10: 13:00:00 500 mg via INTRAVENOUS
  Filled 2019-05-10 (×2): qty 500

## 2019-05-10 MED ORDER — SODIUM CHLORIDE 0.9 % IV SOLN: 100.0000 mg | Freq: Every day | INTRAVENOUS | Status: DC

## 2019-05-10 MED ORDER — SODIUM CHLORIDE 0.9 % IV SOLN
500.0000 mg | INTRAVENOUS | Status: DC
  Administered 2019-05-11 – 2019-05-13 (×3): 500 mg via INTRAVENOUS
  Filled 2019-05-10 (×3): qty 500

## 2019-05-10 MED ORDER — ACETAMINOPHEN 500 MG PO TABS
500.0000 mg | ORAL_TABLET | Freq: Four times a day (QID) | ORAL | Status: DC | PRN
  Administered 2019-05-10: 1000 mg via ORAL
  Filled 2019-05-10 (×2): qty 1

## 2019-05-10 MED ORDER — SODIUM CHLORIDE 0.9 % IV SOLN
1.0000 g | INTRAVENOUS | Status: DC
  Administered 2019-05-10: 1 g via INTRAVENOUS
  Filled 2019-05-10: qty 10
  Filled 2019-05-10: qty 1

## 2019-05-10 MED ORDER — SODIUM CHLORIDE 0.9 % IV SOLN
1.0000 g | INTRAVENOUS | Status: DC
  Administered 2019-05-11 – 2019-05-13 (×3): 1 g via INTRAVENOUS
  Filled 2019-05-10 (×3): qty 10

## 2019-05-10 MED ORDER — TRAMADOL HCL 50 MG PO TABS
50.0000 mg | ORAL_TABLET | Freq: Four times a day (QID) | ORAL | Status: DC | PRN
  Administered 2019-05-11: 50 mg via ORAL
  Filled 2019-05-10: qty 1

## 2019-05-10 MED ORDER — FUROSEMIDE 10 MG/ML IJ SOLN
40.0000 mg | Freq: Once | INTRAMUSCULAR | Status: AC
  Administered 2019-05-10: 09:00:00 40 mg via INTRAVENOUS
  Filled 2019-05-10: qty 4

## 2019-05-10 MED ORDER — ONDANSETRON HCL 4 MG/2ML IJ SOLN: 4.0000 mg | Freq: Four times a day (QID) | INTRAMUSCULAR | Status: DC | PRN

## 2019-05-10 MED ORDER — DEXAMETHASONE SODIUM PHOSPHATE 10 MG/ML IJ SOLN
6.0000 mg | INTRAMUSCULAR | Status: DC
  Administered 2019-05-10 – 2019-05-12 (×3): 6 mg via INTRAVENOUS
  Filled 2019-05-10 (×3): qty 1

## 2019-05-10 MED ORDER — SODIUM CHLORIDE 0.9 % IV SOLN: 200.0000 mg | Freq: Once | INTRAVENOUS | Status: DC

## 2019-05-10 MED ORDER — SODIUM CHLORIDE 0.9 % IV SOLN: 500.0000 mg | INTRAVENOUS | Status: DC

## 2019-05-10 NOTE — H&P (Signed)
Chad Welch  ZYS:[id number] DOB: 02-10-1961 DOA: 05/10/2019 PCP: Leslye Peer, PA    Brief Narrative:  59 year old with no significant past medical history who presented to the ED at [facility] 12/29 with complaints of generalized fatigue, fever, and body aches of approximately 1 weeks duration.  He reported exposure to many Covid positive individuals at his work.  He originally presented to a local urgent care, was found to be Covid positive and hypoxic, and sent to the ED.  In the ED CXR noted bilateral infiltrates.  Significant Events: 12/29 admit to [facility] via [facility] ED 12/30 CTa negative for pulmonary embolism -weaned to room air 12/31 oxygen requirement increased to 3 L nasal cannula 1/1 oxygen requirement increased to 8 L high flow nasal cannula -transfer to [facility]  COVID-19 specific Treatment: Remdesivir 12/29 > Decadron 12/29 >  Antimicrobials:  Rocephin 1/1 > Azithromycin 1/1 >  Subjective: Requiring 7 L high flow nasal cannula support upon arrival to the GBC to keep saturations 88-91%.   Assessment & Plan:  Covid pneumonia Continue remdesivir and Decadron   Recent Labs  Lab 05/07/19 1239 05/07/19 1937 05/08/19 0449 05/09/19 0643 05/10/19 0520 05/10/19 0950  FERRITIN  --  679*  --   --   --   --   ALT 106*  --  76* 63* 79*  --   PROCALCITON 0.50 1.06  --   --   --  0.39    Mediastinal and bilateral hilar adenopathy Possibly related to Covid but not a typical finding -will need to be reevaluated then follow-up once patient has recovered from Covid  GERD  DVT prophylaxis: lovenox Code Status: FULL CODE Family Communication:  Disposition Plan:   Consultants:  none  Objective: There were no vitals taken for this visit. No intake or output data in the 24 hours ending 05/10/19 1759 There were no vitals filed for this visit.  Examination: General: No acute respiratory distress Lungs: Clear to auscultation bilaterally without wheezes or crackles  Cardiovascular: Regular rate and rhythm without murmur gallop or rub normal S1 and S2 Abdomen: Nontender, nondistended, soft, bowel sounds positive, no rebound, no ascites, no appreciable mass Extremities: No significant cyanosis, clubbing, or edema bilateral lower extremities  CBC: Recent Labs  Lab 05/08/19 0449 05/09/19 0643 05/10/19 0520  WBC 14.9* 18.1* 15.3*  NEUTROABS 13.9* 16.3* 12.8*  HGB 12.8* 13.9 13.8  HCT 37.5* 42.7 42.2  MCV 86.6 90.7 89.6  PLT 216 306 347   Basic Metabolic Panel: Recent Labs  Lab 05/08/19 0449 05/09/19 0643 05/10/19 0520  NA 136 140 140  K 4.0 4.4 4.2  CL 100 103 103  CO2 26 28 26   GLUCOSE 162* 150* 131*  BUN 19 20 20   CREATININE 0.93 0.83 0.82  CALCIUM 8.9 8.8* 8.8*   GFR: Estimated Creatinine Clearance: 102.6 mL/min (by C-G formula based on SCr of 0.82 mg/dL).  Liver Function Tests: Recent Labs  Lab 05/07/19 1239 05/08/19 0449 05/09/19 0643 05/10/19 0520  AST 74* 44* 41 59*  ALT 106* 76* 63* 79*  ALKPHOS 239* 232* 191* 206*  BILITOT 1.1 0.7 0.6 0.6  PROT 7.8 7.1 7.4 7.1  ALBUMIN 3.4* 3.1* 3.1* 2.9*   No results for input(s): LIPASE, AMYLASE in the last 168 hours. No results for input(s): AMMONIA in the last 168 hours.  Coagulation Profile: No results for input(s): INR, PROTIME in the last 168 hours.  Cardiac Enzymes: No results for input(s): CKTOTAL, CKMB, CKMBINDEX, TROPONINI in the last 168  hours.  HbA1C: No results found for: HGBA1C  CBG: No results for input(s): GLUCAP in the last 168 hours.  Recent Results (from the past 240 hour(s))  Blood Culture (routine x 2)     Status: None (Preliminary result)   Collection Time: 05/07/19  2:52 PM   Specimen: BLOOD  Result Value Ref Range Status   Specimen Description BLOOD RIGHT ANTECUBITAL  Final   Special Requests   Final    BOTTLES DRAWN AEROBIC AND ANAEROBIC Blood Culture adequate volume   Culture   Final    NO GROWTH 3 DAYS Performed at [facility],  [address], [place], [place] [address]    Report Status PENDING  Incomplete  Blood Culture (routine x 2)     Status: None (Preliminary result)   Collection Time: 05/07/19  2:52 PM   Specimen: BLOOD  Result Value Ref Range Status   Specimen Description BLOOD BLOOD RIGHT HAND  Final   Special Requests   Final    BOTTLES DRAWN AEROBIC AND ANAEROBIC Blood Culture results may not be optimal due to an excessive volume of blood received in culture bottles   Culture   Final    NO GROWTH 3 DAYS Performed at [facility], [address] [place], [place] [address]    Report Status PENDING  Incomplete  Respiratory Panel by RT PCR (Flu A&B, Covid) - Nasopharyngeal Swab     Status: Abnormal   Collection Time: 05/10/19  1:05 PM   Specimen: Nasopharyngeal Swab  Result Value Ref Range Status   SARS Coronavirus 2 by RT PCR POSITIVE (A) NEGATIVE Final    Comment: RESULT CALLED TO, READ BACK BY AND VERIFIED WITH: MATTHEW WRIGHT @1427  ON 05/10/2019 BY FMW (NOTE) SARS-CoV-2 target nucleic acids are DETECTED. SARS-CoV-2 RNA is generally detectable in upper respiratory specimens  during the acute phase of infection. Positive results are indicative of the presence of the identified virus, but do not rule out bacterial infection or co-infection with other pathogens not detected by the test. Clinical correlation with patient history and other diagnostic information is necessary to determine patient infection status. The expected result is Negative. Fact Sheet for Patients:  07/08/2019 Fact Sheet for Healthcare Providers: https://www.moore.com/ This test is not yet approved or cleared by the https://www.young.biz/ FDA and  has been authorized for detection and/or diagnosis of SARS-CoV-2 by FDA under an Emergency Use Authorization (EUA).  This EUA will remain in effect (meaning this test can be u sed) for the duration of  the COVID-19 declaration  under Section 564(b)(1) of the Act, 21 U.S.C. section 360bbb-3(b)(1), unless the authorization is terminated or revoked sooner.    Influenza A by PCR NEGATIVE NEGATIVE Final   Influenza B by PCR NEGATIVE NEGATIVE Final    Comment: (NOTE) The Xpert Xpress SARS-CoV-2/FLU/RSV assay is intended as an aid in  the diagnosis of influenza from Nasopharyngeal swab specimens and  should not be used as a sole basis for treatment. Nasal washings and  aspirates are unacceptable for Xpert Xpress SARS-CoV-2/FLU/RSV  testing. Fact Sheet for Patients: [place] Fact Sheet for Healthcare Providers: https://www.moore.com/ This test is not yet approved or cleared by the https://www.young.biz/ FDA and  has been authorized for detection and/or diagnosis of SARS-CoV-2 by  FDA under an Emergency Use Authorization (EUA). This EUA will remain  in effect (meaning this test can be used) for the duration of the  Covid-19 declaration under Section 564(b)(1) of the Act, 21  U.S.C. section  360bbb-3(b)(1), unless the authorization is  terminated or revoked. Performed at [facility], [address] [place], [place] [address]      Scheduled Meds: Continuous Infusions:   LOS: 0 days   Cherene Altes, MD [facility] Office  [phone] Pager - Text Page per Amion  If 7PM-7AM, please contact night-coverage per Amion 05/10/2019, 5:59 PM

## 2019-05-10 NOTE — Plan of Care (Addendum)
Spoke with Lucianne Muss, RN and gave report.  Going to room 177 and [facility].  [facility] has already contacted [place] to confirm transport and stated patient would need to go on [facility] 15L for transport.  Working to confirm covid positive test - per [facility] chrg request.  Rapid Covid screen completed and confirm patinet is covid positive.  [facility] contacted to inform  - Now ready for transport.  Dr informed of positive result and wife informed of transfer and room number.

## 2019-05-10 NOTE — Discharge Summary (Signed)
Physician Discharge Summary  Chad Welch WER:[id number] DOB: 1960-06-06 DOA: 05/07/2019  PCP: Leslye Peer, PA  Admit date: 05/07/2019 Discharge date: 05/10/2019  Admitted From: Home Disposition:  [facility]  Recommendations for Outpatient Follow-up:  1. Follow up with PCP in 1-2 weeks   Home Health: No Equipment/Devices:None Discharge Condition:Guarded CODE STATUS:Full Diet recommendation: Regular   Brief/Interim Summary: Chad Welch a 59 y.o.malewithno significantmedical historywho presents for evaluation fever fatigue and body aches associated with cough that started approximately a week prior to presentation. Patient reports a several Covid positive individuals at his work. He has been treating his fevers at home with antipyretics. The patient initially went to urgent care where his Covid test came back positive. He was found to be hypoxic so they sent him to the emergency department. On presentation to the emergency department he was confirmed to be hypoxic and placed on 2 to 3 L nasal cannula with good result. Chest x-ray was performed that demonstrated bilateral infiltrates concerning for viral pneumonia. Patient is afebrile and remainder vital signs within normal limits.Lactic acid was initially elevated at 2.3 and subsequently improved to 1.6. Patient has a elevated white count of 14.8 notable neutrophilic predominance as well as lymphopenia.  12/30: Patient doing well overall.  CT chest negative for pulmonary embolism.  Radiographic evidence of bilateral consistent with lobar pneumonia.  Weaned off supplemental oxygen this morning.  On room air.  12/31: Patient remains on 2 to 3 L nasal cannula.  Drop saturations yesterday during the day and required supplemental oxygen.  Overall feels well.  1/1: Oxygen status worsening overnight.  Patient now requiring 8 L high flow.  Still able to speak in complete sentences.  Meets threshold for transfer  to [facility].  Transfer service is called.  Discussed plan of care with wife.  Discharge Diagnoses:  Active Problems:   COVID-19 COVID-19 pneumonia Acute respiratory failure with hypoxia- worsening Oxygen requirements worsening over interval Patient upset about the possibility of transfer, wants to go home Spoke to the wife via phone, okay with transfer request to [facility] Procalcitonin mildly elevated indicating possible bacterial coinfection Plan: Continue COVID-19 isolation precautions Continue remdesivir, day 4 of 5 Dexamethasone 6 mg daily, day 4 of 10 Wean down oxygen as tolerated Supplemental measures, incentive spirometry, flutter valve Empiric Rocephin and azithromycin for CAP coverage Attempt Lasix challenge 40 mg IV x1, reassess oxygen [facility] called for transfer request to [facility]   GERD Daily protonix   Discharge Instructions  Discharge Instructions    Diet - low sodium heart healthy   Complete by: As directed    Increase activity slowly   Complete by: As directed      Allergies as of 05/10/2019   No Known Allergies     Medication List    STOP taking these medications   ibuprofen 200 MG tablet Commonly known as: ADVIL   omeprazole 10 MG capsule Commonly known as: PRILOSEC   terbinafine 250 MG tablet Commonly known as: LAMISIL     TAKE these medications   acetaminophen 500 MG tablet Commonly known as: TYLENOL Take 500-1,000 mg by mouth every 6 (six) hours as needed for moderate pain or fever.   azithromycin 500 mg in sodium chloride 0.9 % 250 mL Inject 500 mg into the vein daily.   cefTRIAXone 1 g in sodium chloride 0.9 % 100 mL Inject 1 g into the vein daily.       No Known Allergies  Consultations:  none   Procedures/Studies: CT ANGIO CHEST PE W OR WO CONTRAST  Result Date: 05/07/2019 CLINICAL DATA:  Shortness of breath.  COVID positive. EXAM: CT ANGIOGRAPHY CHEST WITH CONTRAST  TECHNIQUE: Multidetector CT imaging of the chest was performed using the standard protocol during bolus administration of intravenous contrast. Multiplanar CT image reconstructions and MIPs were obtained to evaluate the vascular anatomy. CONTRAST:  29mL OMNIPAQUE IOHEXOL 350 MG/ML SOLN COMPARISON:  Chest x-ray from same day. FINDINGS: Cardiovascular: Satisfactory opacification of the pulmonary arteries to the segmental level. No evidence of pulmonary embolism. Normal heart size. No pericardial effusion. No thoracic aortic aneurysm or dissection. Mediastinum/Nodes: Enlarged mediastinal and bilateral hilar lymph nodes measuring up to 1.7 cm. No enlarged axillary lymph nodes. The thyroid gland, trachea, and esophagus demonstrate no significant findings. Lungs/Pleura: Diffuse patchy ground-glass density with inter- and intralobular septal thickening throughout both lungs, with more consolidative opacity in both lower lobes. No pleural effusion or pneumothorax. Upper Abdomen: No acute abnormality. Musculoskeletal: No chest wall abnormality. No acute or significant osseous findings. Review of the MIP images confirms the above findings. IMPRESSION: 1.  No evidence of pulmonary embolism. 2. Findings consistent with extensive multifocal pneumonia, with appearance typical of COVID-19 pneumonia. 3. Mediastinal and bilateral hilar lymphadenopathy, presumably reactive, although this is uncommon in COVID-19 infection. Consider follow-up chest CT in 3 months to evaluate for interval change. Electronically Signed   By: Obie Dredge M.D.   On: 05/07/2019 19:16   DG Chest Port 1 View  Result Date: 05/07/2019 CLINICAL DATA:  Body aches and shortness of breath.  COVID positive. EXAM: PORTABLE CHEST 1 VIEW COMPARISON:  None. FINDINGS: The cardiac silhouette, mediastinal and hilar contours are within normal limits. There are patchy hazy bilateral lung infiltrates consistent with COVID pneumonia. No pleural effusions or  pneumothorax. The bony thorax is intact. IMPRESSION: Patchy bilateral interstitial infiltrates consistent with COVID pneumonia. Electronically Signed   By: Rudie Meyer M.D.   On: 05/07/2019 12:42       Subjective:   Discharge Exam: Vitals:   05/10/19 1405 05/10/19 1456  BP: 110/78 112/82  Pulse: 83 88  Resp:  20  Temp: 97.6 F (36.4 C) 98.2 F (36.8 C)  SpO2: 91% 90%   Vitals:   05/10/19 1024 05/10/19 1351 05/10/19 1405 05/10/19 1456  BP:   110/78 112/82  Pulse:   83 88  Resp:    20  Temp:   97.6 F (36.4 C) 98.2 F (36.8 C)  TempSrc:   Oral Oral  SpO2: 91% 94% 91% 90%  Weight:      Height:        General: Pt is alert, awake, not in acute distress Cardiovascular: RRR, S1/S2 +, no rubs, no gallops Respiratory: Scattered diffuse crackles Abdominal: Soft, NT, ND, bowel sounds + Extremities: no edema, no cyanosis    The results of significant diagnostics from this hospitalization (including imaging, microbiology, ancillary and laboratory) are listed below for reference.     Microbiology: Recent Results (from the past 240 hour(s))  Blood Culture (routine x 2)     Status: None (Preliminary result)   Collection Time: 05/07/19  2:52 PM   Specimen: BLOOD  Result Value Ref Range Status   Specimen Description BLOOD RIGHT ANTECUBITAL  Final   Special Requests   Final    BOTTLES DRAWN AEROBIC AND ANAEROBIC Blood Culture adequate volume   Culture   Final    NO GROWTH 3 DAYS Performed at [facility], [address] [place], [place] [address]    Report Status PENDING  Incomplete  Blood Culture (routine x 2)     Status: None (Preliminary result)   Collection Time: 05/07/19  2:52 PM   Specimen: BLOOD  Result Value Ref Range Status   Specimen Description BLOOD BLOOD RIGHT HAND  Final   Special Requests   Final    BOTTLES DRAWN AEROBIC AND ANAEROBIC Blood Culture results may not be optimal due to an excessive volume of blood received in culture bottles    Culture   Final    NO GROWTH 3 DAYS Performed at [facility], [address] [place], [place] [address]    Report Status PENDING  Incomplete  Respiratory Panel by RT PCR (Flu A&B, Covid) - Nasopharyngeal Swab     Status: Abnormal   Collection Time: 05/10/19  1:05 PM   Specimen: Nasopharyngeal Swab  Result Value Ref Range Status   SARS Coronavirus 2 by RT PCR POSITIVE (A) NEGATIVE Final    Comment: RESULT CALLED TO, READ BACK BY AND VERIFIED WITH: MATTHEW WRIGHT @1427  ON 05/10/2019 BY FMW (NOTE) SARS-CoV-2 target nucleic acids are DETECTED. SARS-CoV-2 RNA is generally detectable in upper respiratory specimens  during the acute phase of infection. Positive results are indicative of the presence of the identified virus, but do not rule out bacterial infection or co-infection with other pathogens not detected by the test. Clinical correlation with patient history and other diagnostic information is necessary to determine patient infection status. The expected result is Negative. Fact Sheet for Patients:  https://www.moore.com/https://www.fda.gov/media/142436/download Fact Sheet for Healthcare Providers: https://www.young.biz/https://www.fda.gov/media/142435/download This test is not yet approved or cleared by the [place] FDA and  has been authorized for detection and/or diagnosis of SARS-CoV-2 by FDA under an Emergency Use Authorization (EUA).  This EUA will remain in effect (meaning this test can be u sed) for the duration of  the COVID-19 declaration under Section 564(b)(1) of the Act, 21 U.S.C. section 360bbb-3(b)(1), unless the authorization is terminated or revoked sooner.    Influenza A by PCR NEGATIVE NEGATIVE Final   Influenza B by PCR NEGATIVE NEGATIVE Final    Comment: (NOTE) The Xpert Xpress SARS-CoV-2/FLU/RSV assay is intended as an aid in  the diagnosis of influenza from Nasopharyngeal swab specimens and  should not be used as a sole basis for treatment. Nasal washings and  aspirates are  unacceptable for Xpert Xpress SARS-CoV-2/FLU/RSV  testing. Fact Sheet for Patients: https://www.moore.com/https://www.fda.gov/media/142436/download Fact Sheet for Healthcare Providers: https://www.young.biz/https://www.fda.gov/media/142435/download This test is not yet approved or cleared by the [place] FDA and  has been authorized for detection and/or diagnosis of SARS-CoV-2 by  FDA under an Emergency Use Authorization (EUA). This EUA will remain  in effect (meaning this test can be used) for the duration of the  Covid-19 declaration under Section 564(b)(1) of the Act, 21  U.S.C. section 360bbb-3(b)(1), unless the authorization is  terminated or revoked. Performed at [facility], [address] [place], [place] [address]      Labs: BNP (last 3 results) Recent Labs    05/07/19 1452  BNP 237.0*   Basic Metabolic Panel: Recent Labs  Lab 05/07/19 1239 05/07/19 1937 05/08/19 0449 05/09/19 0643 05/10/19 0520  NA 135  --  136 140 140  K 4.0  --  4.0 4.4 4.2  CL 98  --  100 103 103  CO2 27  --  26 28 26   GLUCOSE 146*  --  162* 150* 131*  BUN 19  --  19 20 20   CREATININE 0.94 0.97 0.93 0.83 0.82  CALCIUM 8.8*  --  8.9 8.8* 8.8*   Liver Function Tests: Recent Labs  Lab 05/07/19 1239 05/08/19 0449 05/09/19 0643 05/10/19 0520  AST 74* 44* 41 59*  ALT 106* 76* 63* 79*  ALKPHOS 239* 232* 191* 206*  BILITOT 1.1 0.7 0.6 0.6  PROT 7.8 7.1 7.4 7.1  ALBUMIN 3.4* 3.1* 3.1* 2.9*   No results for input(s): LIPASE, AMYLASE in the last 168 hours. No results for input(s): AMMONIA in the last 168 hours. CBC: Recent Labs  Lab 05/07/19 1239 05/07/19 1937 05/08/19 0449 05/09/19 0643 05/10/19 0520  WBC 14.8* 15.7* 14.9* 18.1* 15.3*  NEUTROABS 13.9*  --  13.9* 16.3* 12.8*  HGB 13.3 13.4 12.8* 13.9 13.8  HCT 40.8 40.4 37.5* 42.7 42.2  MCV 90.7 90.0 86.6 90.7 89.6  PLT 200 202 216 306 347   Cardiac Enzymes: No results for input(s): CKTOTAL, CKMB, CKMBINDEX, TROPONINI in the last 168  hours. BNP: Invalid input(s): POCBNP CBG: No results for input(s): GLUCAP in the last 168 hours. D-Dimer No results for input(s): DDIMER in the last 72 hours. Hgb A1c No results for input(s): HGBA1C in the last 72 hours. Lipid Profile No results for input(s): CHOL, HDL, LDLCALC, TRIG, CHOLHDL, LDLDIRECT in the last 72 hours. Thyroid function studies No results for input(s): TSH, T4TOTAL, T3FREE, THYROIDAB in the last 72 hours.  Invalid input(s): FREET3 Anemia work up 07/08/19    05/07/19 1937  FERRITIN 679*   Urinalysis No results found for: COLORURINE, APPEARANCEUR, LABSPEC, PHURINE, GLUCOSEU, HGBUR, BILIRUBINUR, KETONESUR, PROTEINUR, UROBILINOGEN, NITRITE, LEUKOCYTESUR Sepsis Labs Invalid input(s): PROCALCITONIN,  WBC,  LACTICIDVEN Microbiology Recent Results (from the past 240 hour(s))  Blood Culture (routine x 2)     Status: None (Preliminary result)   Collection Time: 05/07/19  2:52 PM   Specimen: BLOOD  Result Value Ref Range Status   Specimen Description BLOOD RIGHT ANTECUBITAL  Final   Special Requests   Final    BOTTLES DRAWN AEROBIC AND ANAEROBIC Blood Culture adequate volume   Culture   Final    NO GROWTH 3 DAYS Performed at [facility], [address], [place], [place]    Report Status PENDING  Incomplete  Blood Culture (routine x 2)     Status: None (Preliminary result)   Collection Time: 05/07/19  2:52 PM   Specimen: BLOOD  Result Value Ref Range Status   Specimen Description BLOOD BLOOD RIGHT HAND  Final   Special Requests   Final    BOTTLES DRAWN AEROBIC AND ANAEROBIC Blood Culture results may not be optimal due to an excessive volume of blood received in culture bottles   Culture   Final    NO GROWTH 3 DAYS Performed at [facility], [address] [place], [place]    Report Status PENDING  Incomplete  Respiratory Panel by RT PCR (Flu A&B, Covid) - Nasopharyngeal Swab     Status: Abnormal   Collection  Time: 05/10/19  1:05 PM   Specimen: Nasopharyngeal Swab  Result Value Ref Range Status   SARS Coronavirus 2 by RT PCR POSITIVE (A) NEGATIVE Final    Comment: RESULT CALLED TO, READ BACK BY AND VERIFIED WITH: MATTHEW WRIGHT @1427  ON 05/10/2019 BY FMW (NOTE) SARS-CoV-2 target nucleic acids are DETECTED. SARS-CoV-2 RNA is generally detectable in upper respiratory specimens  during the acute phase of infection. Positive results are indicative of the presence of the identified  virus, but do not rule out bacterial infection or co-infection with other pathogens not detected by the test. Clinical correlation with patient history and other diagnostic information is necessary to determine patient infection status. The expected result is Negative. Fact Sheet for Patients:  PinkCheek.be Fact Sheet for Healthcare Providers: GravelBags.it This test is not yet approved or cleared by the [place] FDA and  has been authorized for detection and/or diagnosis of SARS-CoV-2 by FDA under an Emergency Use Authorization (EUA).  This EUA will remain in effect (meaning this test can be u sed) for the duration of  the COVID-19 declaration under Section 564(b)(1) of the Act, 21 U.S.C. section 360bbb-3(b)(1), unless the authorization is terminated or revoked sooner.    Influenza A by PCR NEGATIVE NEGATIVE Final   Influenza B by PCR NEGATIVE NEGATIVE Final    Comment: (NOTE) The Xpert Xpress SARS-CoV-2/FLU/RSV assay is intended as an aid in  the diagnosis of influenza from Nasopharyngeal swab specimens and  should not be used as a sole basis for treatment. Nasal washings and  aspirates are unacceptable for Xpert Xpress SARS-CoV-2/FLU/RSV  testing. Fact Sheet for Patients: PinkCheek.be Fact Sheet for Healthcare Providers: GravelBags.it This test is not yet approved or cleared by the [place] FDA and  has been authorized for detection and/or diagnosis of SARS-CoV-2 by  FDA under an Emergency Use Authorization (EUA). This EUA will remain  in effect (meaning this test can be used) for the duration of the  Covid-19 declaration under Section 564(b)(1) of the Act, 21  U.S.C. section 360bbb-3(b)(1), unless the authorization is  terminated or revoked. Performed at [facility], [address], [place], [place] [address]      Time coordinating discharge: Over 30 minutes  SIGNED:   Sidney Ace, MD  [facility] 05/10/2019, 3:29 PM Pager [phone]  If 7PM-7AM, please contact night-coverage [email or web address] Password [email or web address]

## 2019-05-10 NOTE — Progress Notes (Signed)
Called [facility], notified Kathie Rhodes Kathie Rhodes to speak with primary RN) that patient needs positive Covid result in Epic or faxed to [facility] [phone] prior to pt being admitted to [facility]. Please call with questions. [phone].

## 2019-05-10 NOTE — Plan of Care (Addendum)
Called wife to inform of current status and POC.  Answered all questions as possible.  Wife hopingto transfer to [facility], if possible, if patient continue sto show s/sx of digression.   Contacted Dr. To inform of critical Lactic Acid (2.4) and to please try to contact wife later today, per wife's request.

## 2019-05-10 NOTE — Progress Notes (Signed)
PROGRESS NOTE    Chad Welch  EUM:[id number] DOB: July 07, 1960 DOA: 05/07/2019 PCP: Leslye Peer, PA   Brief Narrative:  Chad Welch is a 59 y.o. male with no significant medical history who presents for evaluation fever fatigue and body aches associated with cough that started approximately a week prior to presentation.  Patient reports a several Covid positive individuals at his work.  He has been treating his fevers at home with antipyretics.  The patient initially went to urgent care where his Covid test came back positive.  He was found to be hypoxic so they sent him to the emergency department.  On presentation to the emergency department he was confirmed to be hypoxic and placed on 2 to 3 L nasal cannula with good result.  Chest x-ray was performed that demonstrated bilateral infiltrates concerning for viral pneumonia.  Patient is afebrile and remainder vital signs within normal limits.  Lactic acid was initially elevated at 2.3 and subsequently improved to 1.6.  Patient has a elevated white count of 14.8 notable neutrophilic predominance as well as lymphopenia.  12/30: Patient doing well overall.  CT chest negative for pulmonary embolism.  Radiographic evidence of bilateral consistent with lobar pneumonia.  Weaned off supplemental oxygen this morning.  On room air.  12/31: Patient remains on 2 to 3 L nasal cannula.  Drop saturations yesterday during the day and required supplemental oxygen.  Overall feels well.  1/1: Oxygen status worsening overnight.  Patient now requiring 8 L high flow.  Still able to speak in complete sentences.  Meets threshold for transfer to [facility].  Transfer service is called.  Discussed plan of care with wife.   Assessment & Plan:   Active Problems:   COVID-19   COVID-19 pneumonia Acute respiratory failure with hypoxia- worsening Oxygen requirements worsening over interval Patient upset about the possibility of transfer, wants to go  home Spoke to the wife via phone, okay with transfer request to [facility] Procalcitonin mildly elevated indicating possible bacterial coinfection Plan: Continue COVID-19 isolation precautions Continue remdesivir, day 4 of 5 Dexamethasone 6 mg daily, day 4 of 10 Wean down oxygen as tolerated Supplemental measures, incentive spirometry, flutter valve Empiric Rocephin and azithromycin for CAP coverage Attempt Lasix challenge 40 mg IV x1, reassess oxygen [facility] called for transfer request to [facility]   GERD Daily protonix   DVT prophylaxis: Lovenox Code Status: Full Family Communication: Wife Arline Asp via phone [phone] om 1/1 Disposition Plan: [facility] when bed available  Consultants:   None  Procedures: None Antimicrobials through remdesivir (05/07/19-  )  Subjective: Seen and examined Oxygen requirements worsening overnight Requiring 8 L supplemental oxygen Unable to wean  Objective: Vitals:   05/09/19 2045 05/10/19 0416 05/10/19 0503 05/10/19 0612  BP:  118/78    Pulse:  81    Resp:      Temp:  98.3 F (36.8 C)    TempSrc:  Oral    SpO2: 93% 90%  92%  Weight:   76.1 kg   Height:        Intake/Output Summary (Last 24 hours) at 05/10/2019 1135 Last data filed at 05/10/2019 1010 Gross per 24 hour  Intake 0 ml  Output 800 ml  Net -800 ml   Filed Weights   05/07/19 1230 05/10/19 0503  Weight: 79.4 kg 76.1 kg    Examination:  General exam: Appears calm and comfortable  Respiratory system: Scattered crackles, mildly increased work of breathing Cardiovascular system:  S1 & S2 heard, RRR. No JVD, murmurs, rubs, gallops or clicks. No pedal edema. Gastrointestinal system: Abdomen is nondistended, soft and nontender. No organomegaly or masses felt. Normal bowel sounds heard. Central nervous system: Alert and oriented. No focal neurological deficits. Extremities: Symmetric 5 x 5 power. Skin: No rashes, lesions or  ulcers Psychiatry: Judgement and insight appear normal. Mood & affect appropriate.     Data Reviewed: I have personally reviewed following labs and imaging studies  CBC: Recent Labs  Lab 05/07/19 1239 05/07/19 1937 05/08/19 0449 05/09/19 0643 05/10/19 0520  WBC 14.8* 15.7* 14.9* 18.1* 15.3*  NEUTROABS 13.9*  --  13.9* 16.3* 12.8*  HGB 13.3 13.4 12.8* 13.9 13.8  HCT 40.8 40.4 37.5* 42.7 42.2  MCV 90.7 90.0 86.6 90.7 89.6  PLT 200 202 216 306 347   Basic Metabolic Panel: Recent Labs  Lab 05/07/19 1239 05/07/19 1937 05/08/19 0449 05/09/19 0643 05/10/19 0520  NA 135  --  136 140 140  K 4.0  --  4.0 4.4 4.2  CL 98  --  100 103 103  CO2 27  --  26 28 26   GLUCOSE 146*  --  162* 150* 131*  BUN 19  --  19 20 20   CREATININE 0.94 0.97 0.93 0.83 0.82  CALCIUM 8.8*  --  8.9 8.8* 8.8*   GFR: Estimated Creatinine Clearance: 102.6 mL/min (by C-G formula based on SCr of 0.82 mg/dL). Liver Function Tests: Recent Labs  Lab 05/07/19 1239 05/08/19 0449 05/09/19 0643 05/10/19 0520  AST 74* 44* 41 59*  ALT 106* 76* 63* 79*  ALKPHOS 239* 232* 191* 206*  BILITOT 1.1 0.7 0.6 0.6  PROT 7.8 7.1 7.4 7.1  ALBUMIN 3.4* 3.1* 3.1* 2.9*   No results for input(s): LIPASE, AMYLASE in the last 168 hours. No results for input(s): AMMONIA in the last 168 hours. Coagulation Profile: No results for input(s): INR, PROTIME in the last 168 hours. Cardiac Enzymes: No results for input(s): CKTOTAL, CKMB, CKMBINDEX, TROPONINI in the last 168 hours. BNP (last 3 results) No results for input(s): PROBNP in the last 8760 hours. HbA1C: No results for input(s): HGBA1C in the last 72 hours. CBG: No results for input(s): GLUCAP in the last 168 hours. Lipid Profile: No results for input(s): CHOL, HDL, LDLCALC, TRIG, CHOLHDL, LDLDIRECT in the last 72 hours. Thyroid Function Tests: No results for input(s): TSH, T4TOTAL, FREET4, T3FREE, THYROIDAB in the last 72 hours. Anemia Panel: Recent Labs     05/07/19 1937  FERRITIN 679*   Sepsis Labs: Recent Labs  Lab 05/07/19 1221 05/07/19 1239 05/07/19 1452 05/07/19 1937 05/10/19 0950  PROCALCITON  --  0.50  --  1.06 0.39  LATICACIDVEN 2.3*  --  1.6  --  2.4*    Recent Results (from the past 240 hour(s))  Blood Culture (routine x 2)     Status: None (Preliminary result)   Collection Time: 05/07/19  2:52 PM   Specimen: BLOOD  Result Value Ref Range Status   Specimen Description BLOOD RIGHT ANTECUBITAL  Final   Special Requests   Final    BOTTLES DRAWN AEROBIC AND ANAEROBIC Blood Culture adequate volume   Culture   Final    NO GROWTH 3 DAYS Performed at [facility], [address] [place] [facility] [place], [address] [place]    Report Status PENDING  Incomplete  Blood Culture (routine x 2)     Status: None (Preliminary result)   Collection Time: 05/07/19  2:52 PM   Specimen: BLOOD  Result Value Ref Range Status   Specimen Description BLOOD BLOOD RIGHT HAND  Final   Special Requests   Final    BOTTLES DRAWN AEROBIC AND ANAEROBIC Blood Culture results may not be optimal due to an excessive volume of blood received in culture bottles   Culture   Final    NO GROWTH 3 DAYS Performed at [facility], [address], [place], [place] [address]    Report Status PENDING  Incomplete         Radiology Studies: No results found.      Scheduled Meds: . dexamethasone (DECADRON) injection  6 mg Intravenous Q24H  . enoxaparin (LOVENOX) injection  40 mg Subcutaneous Q24H  . folic acid  1 mg Oral Daily  . multivitamin with minerals  1 tablet Oral Daily  . pantoprazole  40 mg Oral Daily  . thiamine  100 mg Oral Daily   Continuous Infusions: . sodium chloride 10 mL (05/09/19 0827)  . azithromycin    . cefTRIAXone (ROCEPHIN)  IV    . remdesivir 100 mg in NS 100 mL 100 mg (05/10/19 0838)     LOS: 3 days    Time spent: 35 minutes    Sidney Ace, MD [facility] Pager [phone]  If 7PM-7AM,  please contact night-coverage [email or web address] Password [facility] 05/10/2019, 11:35 AM

## 2019-05-10 NOTE — Progress Notes (Signed)
Patient with increased oxygen needs with oxygen saturation 91% on 4 L [place]. Maximizing oxygenation strategies. Current wight down from previously reported, therefore excess fluid not likely contributing factor..Reinforced suggestion of self proning if patint is able to tolerate.

## 2019-05-11 LAB — CBC WITH DIFFERENTIAL/PLATELET
Abs Immature Granulocytes: 0.88 10*3/uL — ABNORMAL HIGH (ref 0.00–0.07)
Basophils Absolute: 0 10*3/uL (ref 0.0–0.1)
Basophils Relative: 0 %
Eosinophils Absolute: 0 10*3/uL (ref 0.0–0.5)
Eosinophils Relative: 0 %
HCT: 44.6 % (ref 39.0–52.0)
Hemoglobin: 14.4 g/dL (ref 13.0–17.0)
Immature Granulocytes: 8 %
Lymphocytes Relative: 11 %
Lymphs Abs: 1.1 10*3/uL (ref 0.7–4.0)
MCH: 29.4 pg (ref 26.0–34.0)
MCHC: 32.3 g/dL (ref 30.0–36.0)
MCV: 91 fL (ref 80.0–100.0)
Monocytes Absolute: 0.8 10*3/uL (ref 0.1–1.0)
Monocytes Relative: 7 %
Neutro Abs: 7.9 10*3/uL — ABNORMAL HIGH (ref 1.7–7.7)
Neutrophils Relative %: 74 %
Platelets: 379 10*3/uL (ref 150–400)
RBC: 4.9 MIL/uL (ref 4.22–5.81)
RDW: 12.8 % (ref 11.5–15.5)
WBC: 10.8 10*3/uL — ABNORMAL HIGH (ref 4.0–10.5)
nRBC: 0.4 % — ABNORMAL HIGH (ref 0.0–0.2)

## 2019-05-11 LAB — COMPREHENSIVE METABOLIC PANEL
ALT: 229 U/L — ABNORMAL HIGH (ref 0–44)
AST: 169 U/L — ABNORMAL HIGH (ref 15–41)
Albumin: 3.1 g/dL — ABNORMAL LOW (ref 3.5–5.0)
Alkaline Phosphatase: 195 U/L — ABNORMAL HIGH (ref 38–126)
Anion gap: 12 (ref 5–15)
BUN: 24 mg/dL — ABNORMAL HIGH (ref 6–20)
CO2: 26 mmol/L (ref 22–32)
Calcium: 8.8 mg/dL — ABNORMAL LOW (ref 8.9–10.3)
Chloride: 98 mmol/L (ref 98–111)
Creatinine, Ser: 0.9 mg/dL (ref 0.61–1.24)
GFR calc Af Amer: >60 mL/min (ref >60)
GFR calc non Af Amer: >60 mL/min (ref >60)
Glucose, Bld: 135 mg/dL — ABNORMAL HIGH (ref 70–99)
Potassium: 4.7 mmol/L (ref 3.5–5.1)
Sodium: 136 mmol/L (ref 135–145)
Total Bilirubin: 0.7 mg/dL (ref 0.3–1.2)
Total Protein: 6.8 g/dL (ref 6.5–8.1)

## 2019-05-11 LAB — C-REACTIVE PROTEIN: CRP: 5.9 mg/dL — ABNORMAL HIGH (ref <1.0)

## 2019-05-11 LAB — FERRITIN: Ferritin: 1307 ng/mL — ABNORMAL HIGH (ref 24–336)

## 2019-05-11 LAB — ABO/RH: ABO/RH(D): A NEG

## 2019-05-11 LAB — D-DIMER, QUANTITATIVE: D-Dimer, Quant: 1.23 ug/mL-FEU — ABNORMAL HIGH (ref 0.00–0.50)

## 2019-05-11 LAB — MAGNESIUM: Magnesium: 2.3 mg/dL (ref 1.7–2.4)

## 2019-05-11 NOTE — Plan of Care (Signed)

## 2019-05-11 NOTE — Plan of Care (Signed)
Plan of Care reviewed. 

## 2019-05-11 NOTE — Plan of Care (Signed)
Spoke with pt wife and provided update.

## 2019-05-11 NOTE — Progress Notes (Addendum)
Chad Welch  KKX:[id number] DOB: 03-15-1961 DOA: 05/10/2019 PCP: Leslye Peer, PA    Brief Narrative:  59 year old with no significant past medical history who presented to the ED at [facility] 12/29 with complaints of generalized fatigue, fever, and body aches of approximately 1 weeks duration.  He reported exposure to many Covid positive individuals at his work.  He originally presented to a local urgent care, was found to be Covid positive and hypoxic, and sent to the ED.  In the ED CXR noted bilateral infiltrates.  Significant Events: 12/29 admit to [facility] via [facility] ED 12/30 CTa negative for pulmonary embolism -weaned to room air 12/31 oxygen requirement increased to 3 L nasal cannula 1/1 oxygen requirement increased to 8 L high flow nasal cannula -transfer to [facility]  COVID-19 specific Treatment: Remdesivir 12/29 > Decadron 12/29 >  Antimicrobials:  Rocephin 1/1 > Azithromycin 1/1 >  Subjective: Saturations holding steady at 92-97% on 8 L high flow nasal cannula. Is alert and reports that he feels much better. Denies cp, n/v, or abdom pain.   Assessment & Plan:  Covid pneumonia Continue remdesivir and Decadron - appears to be improving clinically, LFTs climbing, and concern exists for bacterial infection therefore will hold off on actemra or baricitinib - wean O2 as able - continue empiric abx for possible superimposed CAP  Recent Labs  Lab 05/07/19 1239 05/07/19 1937 05/08/19 0449 05/09/19 0643 05/10/19 0520 05/10/19 0950 05/11/19 0147  DDIMER  --   --   --   --   --   --  1.23*  FERRITIN  --  679*  --   --   --   --  1,307*  CRP  --   --   --   --   --   --  5.9*  ALT 106*  --  76* 63* 79*  --  229*  PROCALCITON 0.50 1.06  --   --   --  0.39  --     Transaminitis Likely due to Covid versus remdesivir or both -follow trend for now  Recent Labs  Lab 05/07/19 1239 05/08/19 0449 05/09/19 0643 05/10/19 0520 05/11/19 0147  AST 74* 44* 41 59* 169*  ALT  106* 76* 63* 79* 229*  ALKPHOS 239* 232* 191* 206* 195*  BILITOT 1.1 0.7 0.6 0.6 0.7  PROT 7.8 7.1 7.4 7.1 6.8  ALBUMIN 3.4* 3.1* 3.1* 2.9* 3.1*     Mediastinal and bilateral hilar adenopathy Possibly related to Covid but not a typical finding -will need to be reevaluated then follow-up once patient has recovered from Covid  GERD Continue home medical therapy  DVT prophylaxis: lovenox Code Status: FULL CODE Family Communication:  Disposition Plan:   Consultants:  none  Objective: Blood pressure 103/72, pulse 81, temperature (!) 97 F (36.1 C), temperature source Oral, resp. rate (!) 22, height 5\' 10"  (1.778 m), weight 76.1 kg, SpO2 97 %.  Intake/Output Summary (Last 24 hours) at 05/11/2019 1632 Last data filed at 05/11/2019 1230 Gross per 24 hour  Intake 240 ml  Output 1275 ml  Net -1035 ml   Filed Weights   05/10/19 1837  Weight: 76.1 kg    Examination: General: No acute respiratory distress Lungs: fine crackles throughout - no wheezing  Cardiovascular: Regular rate and rhythm without murmur gallop or rub normal S1 and S2 Abdomen: Nontender, nondistended, soft, bowel sounds positive, no rebound, no ascites, no appreciable mass Extremities: No significant cyanosis, clubbing, or edema bilateral lower extremities  CBC:  Recent Labs  Lab 05/09/19 0643 05/10/19 0520 05/11/19 0147  WBC 18.1* 15.3* 10.8*  NEUTROABS 16.3* 12.8* 7.9*  HGB 13.9 13.8 14.4  HCT 42.7 42.2 44.6  MCV 90.7 89.6 91.0  PLT 306 347 379   Basic Metabolic Panel: Recent Labs  Lab 05/09/19 0643 05/10/19 0520 05/11/19 0147  NA 140 140 136  K 4.4 4.2 4.7  CL 103 103 98  CO2 28 26 26   GLUCOSE 150* 131* 135*  BUN 20 20 24*  CREATININE 0.83 0.82 0.90  CALCIUM 8.8* 8.8* 8.8*  MG  --   --  2.3   GFR: Estimated Creatinine Clearance: 93.5 mL/min (by C-G formula based on SCr of 0.9 mg/dL).  Liver Function Tests: Recent Labs  Lab 05/08/19 0449 05/09/19 0643 05/10/19 0520 05/11/19 0147    AST 44* 41 59* 169*  ALT 76* 63* 79* 229*  ALKPHOS 232* 191* 206* 195*  BILITOT 0.7 0.6 0.6 0.7  PROT 7.1 7.4 7.1 6.8  ALBUMIN 3.1* 3.1* 2.9* 3.1*    Recent Results (from the past 240 hour(s))  Blood Culture (routine x 2)     Status: None (Preliminary result)   Collection Time: 05/07/19  2:52 PM   Specimen: BLOOD  Result Value Ref Range Status   Specimen Description BLOOD RIGHT ANTECUBITAL  Final   Special Requests   Final    BOTTLES DRAWN AEROBIC AND ANAEROBIC Blood Culture adequate volume   Culture   Final    NO GROWTH 4 DAYS Performed at [facility], [address], [place], [place]    Report Status PENDING  Incomplete  Blood Culture (routine x 2)     Status: None (Preliminary result)   Collection Time: 05/07/19  2:52 PM   Specimen: BLOOD  Result Value Ref Range Status   Specimen Description BLOOD BLOOD RIGHT HAND  Final   Special Requests   Final    BOTTLES DRAWN AEROBIC AND ANAEROBIC Blood Culture results may not be optimal due to an excessive volume of blood received in culture bottles   Culture   Final    NO GROWTH 4 DAYS Performed at [facility], [address] [place], [place]    Report Status PENDING  Incomplete  Respiratory Panel by RT PCR (Flu A&B, Covid) - Nasopharyngeal Swab     Status: Abnormal   Collection Time: 05/10/19  1:05 PM   Specimen: Nasopharyngeal Swab  Result Value Ref Range Status   SARS Coronavirus 2 by RT PCR POSITIVE (A) NEGATIVE Final    Comment: RESULT CALLED TO, READ BACK BY AND VERIFIED WITH: MATTHEW WRIGHT @1427  ON 05/10/2019 BY FMW (NOTE) SARS-CoV-2 target nucleic acids are DETECTED. SARS-CoV-2 RNA is generally detectable in upper respiratory specimens  during the acute phase of infection. Positive results are indicative of the presence of the identified virus, but do not rule out bacterial infection or co-infection with other pathogens not detected by the test. Clinical correlation with  patient history and other diagnostic information is necessary to determine patient infection status. The expected result is Negative. Fact Sheet for Patients:  Fact Sheet for Healthcare Providers: 07/08/2019 This test is not yet approved or cleared by the https://www.moore.com/ FDA and  has been authorized for detection and/or diagnosis of SARS-CoV-2 by FDA under an Emergency Use Authorization (EUA).  This EUA will remain in effect (meaning this test can be u sed) for the duration of  the COVID-19 declaration under Section 564(b)(1) of the Act,  21 U.S.C. section 360bbb-3(b)(1), unless the authorization is terminated or revoked sooner.    Influenza A by PCR NEGATIVE NEGATIVE Final   Influenza B by PCR NEGATIVE NEGATIVE Final    Comment: (NOTE) The Xpert Xpress SARS-CoV-2/FLU/RSV assay is intended as an aid in  the diagnosis of influenza from Nasopharyngeal swab specimens and  should not be used as a sole basis for treatment. Nasal washings and  aspirates are unacceptable for Xpert Xpress SARS-CoV-2/FLU/RSV  testing. Fact Sheet for Patients: PinkCheek.be Fact Sheet for Healthcare Providers: GravelBags.it This test is not yet approved or cleared by the [place] FDA and  has been authorized for detection and/or diagnosis of SARS-CoV-2 by  FDA under an Emergency Use Authorization (EUA). This EUA will remain  in effect (meaning this test can be used) for the duration of the  Covid-19 declaration under Section 564(b)(1) of the Act, 21  U.S.C. section 360bbb-3(b)(1), unless the authorization is  terminated or revoked. Performed at [facility], [address] [place], [place] [address]   MRSA PCR Screening     Status: None   Collection Time: 05/10/19  6:50 PM   Specimen: Nasopharyngeal  Result Value Ref Range Status   MRSA by PCR NEGATIVE NEGATIVE  Final    Comment:        The GeneXpert MRSA Assay (FDA approved for NASAL specimens only), is one component of a comprehensive MRSA colonization surveillance program. It is not intended to diagnose MRSA infection nor to guide or monitor treatment for MRSA infections. Performed at [facility], [place] [address], [place], [place] [address]       LOS: 1 day   Cherene Altes, MD [facility] Office  [phone] Pager - Text Page per Amion  If 7PM-7AM, please contact night-coverage per Amion 05/11/2019, 4:32 PM

## 2019-05-12 ENCOUNTER — Inpatient Hospital Stay (HOSPITAL_COMMUNITY): Payer: 59

## 2019-05-12 DIAGNOSIS — J159 Unspecified bacterial pneumonia: Secondary | ICD-10-CM

## 2019-05-12 LAB — COMPREHENSIVE METABOLIC PANEL
ALT: 168 U/L — ABNORMAL HIGH (ref 0–44)
AST: 68 U/L — ABNORMAL HIGH (ref 15–41)
Albumin: 3.1 g/dL — ABNORMAL LOW (ref 3.5–5.0)
Alkaline Phosphatase: 176 U/L — ABNORMAL HIGH (ref 38–126)
Anion gap: 12 (ref 5–15)
BUN: 22 mg/dL — ABNORMAL HIGH (ref 6–20)
CO2: 24 mmol/L (ref 22–32)
Calcium: 9.1 mg/dL (ref 8.9–10.3)
Chloride: 100 mmol/L (ref 98–111)
Creatinine, Ser: 0.81 mg/dL (ref 0.61–1.24)
GFR calc Af Amer: >60 mL/min (ref >60)
GFR calc non Af Amer: >60 mL/min (ref >60)
Glucose, Bld: 135 mg/dL — ABNORMAL HIGH (ref 70–99)
Potassium: 5.2 mmol/L — ABNORMAL HIGH (ref 3.5–5.1)
Sodium: 136 mmol/L (ref 135–145)
Total Bilirubin: 0.8 mg/dL (ref 0.3–1.2)
Total Protein: 6.7 g/dL (ref 6.5–8.1)

## 2019-05-12 LAB — CBC WITH DIFFERENTIAL/PLATELET
Abs Immature Granulocytes: 1.15 10*3/uL — ABNORMAL HIGH (ref 0.00–0.07)
Basophils Absolute: 0 10*3/uL (ref 0.0–0.1)
Basophils Relative: 0 %
Eosinophils Absolute: 0 10*3/uL (ref 0.0–0.5)
Eosinophils Relative: 0 %
HCT: 45.3 % (ref 39.0–52.0)
Hemoglobin: 14.8 g/dL (ref 13.0–17.0)
Immature Granulocytes: 8 %
Lymphocytes Relative: 7 %
Lymphs Abs: 1 10*3/uL (ref 0.7–4.0)
MCH: 29.7 pg (ref 26.0–34.0)
MCHC: 32.7 g/dL (ref 30.0–36.0)
MCV: 90.8 fL (ref 80.0–100.0)
Monocytes Absolute: 0.6 10*3/uL (ref 0.1–1.0)
Monocytes Relative: 4 %
Neutro Abs: 12.1 10*3/uL — ABNORMAL HIGH (ref 1.7–7.7)
Neutrophils Relative %: 81 %
Platelets: 390 10*3/uL (ref 150–400)
RBC: 4.99 MIL/uL (ref 4.22–5.81)
RDW: 12.7 % (ref 11.5–15.5)
WBC: 14.9 10*3/uL — ABNORMAL HIGH (ref 4.0–10.5)
nRBC: 0 % (ref 0.0–0.2)

## 2019-05-12 LAB — CULTURE, BLOOD (ROUTINE X 2)
Culture: NO GROWTH
Culture: NO GROWTH
Special Requests: ADEQUATE

## 2019-05-12 LAB — FERRITIN: Ferritin: 807 ng/mL — ABNORMAL HIGH (ref 24–336)

## 2019-05-12 LAB — C-REACTIVE PROTEIN: CRP: 3.9 mg/dL — ABNORMAL HIGH (ref <1.0)

## 2019-05-12 LAB — D-DIMER, QUANTITATIVE: D-Dimer, Quant: 0.89 ug/mL-FEU — ABNORMAL HIGH (ref 0.00–0.50)

## 2019-05-12 LAB — MAGNESIUM: Magnesium: 2.2 mg/dL (ref 1.7–2.4)

## 2019-05-12 MED ORDER — SALINE SPRAY 0.65 % NA SOLN
1.0000 | NASAL | Status: DC | PRN
  Administered 2019-05-12: 1 via NASAL
  Filled 2019-05-12: qty 44

## 2019-05-12 NOTE — Plan of Care (Signed)
Plan of Care reviewed. 

## 2019-05-12 NOTE — Plan of Care (Signed)
Pt remains on 2LNC with sats >96%. No s/s of SOB. No complaints at this time. VSS and WDL for this pt. Pt is ambulatory.  Problem: Education: Goal: Knowledge of General Education information will improve Description: Including pain rating scale, medication(s)/side effects and non-pharmacologic comfort measures Outcome: Progressing   Problem: Health Behavior/Discharge Planning: Goal: Ability to manage health-related needs will improve Outcome: Progressing   Problem: Clinical Measurements: Goal: Ability to maintain clinical measurements within normal limits will improve Outcome: Progressing Goal: Will remain free from infection Outcome: Progressing Goal: Diagnostic test results will improve Outcome: Progressing Goal: Respiratory complications will improve Outcome: Progressing Goal: Cardiovascular complication will be avoided Outcome: Progressing   Problem: Activity: Goal: Risk for activity intolerance will decrease Outcome: Progressing   Problem: Nutrition: Goal: Adequate nutrition will be maintained Outcome: Progressing   Problem: Coping: Goal: Level of anxiety will decrease Outcome: Progressing   Problem: Elimination: Goal: Will not experience complications related to bowel motility Outcome: Progressing Goal: Will not experience complications related to urinary retention Outcome: Progressing   Problem: Pain Managment: Goal: General experience of comfort will improve Outcome: Progressing   Problem: Safety: Goal: Ability to remain free from injury will improve Outcome: Progressing   Problem: Skin Integrity: Goal: Risk for impaired skin integrity will decrease Outcome: Progressing

## 2019-05-12 NOTE — Progress Notes (Signed)
Chad Welch  FBP:[id number] DOB: 06/28/1960 DOA: 05/10/2019 PCP: Leslye Peer, PA    Brief Narrative:  59 year old with no significant past medical history who presented to the ED at [facility] 12/29 with complaints of generalized fatigue, fever, and body aches of approximately 1 weeks duration.  He reported exposure to many Covid positive individuals at his work.  He originally presented to a local urgent care, was found to be Covid positive and hypoxic, and sent to the ED.  In the ED CXR noted bilateral infiltrates.  Significant Events: 12/29 admit to [facility] via [facility] ED 12/30 CTa negative for pulmonary embolism -weaned to room air 12/31 oxygen requirement increased to 3 L nasal cannula 1/1 oxygen requirement increased to 8 L high flow nasal cannula -transfer to [facility]  COVID-19 specific Treatment: Remdesivir 12/29 > 1/02 Decadron 12/29 >   Antimicrobials:  Rocephin 1/1 > Azithromycin 1/1 >  Subjective: Has been weaned to 2L conventional [place] support w/ sats 95-97%.  Feeling much better overall, though is having extreme difficulty sleeping in the hospital so feels very tired.  Denies chest pain nausea or vomiting.  Has not yet ambulated to a significant extent.  Assessment & Plan:  Covid pneumonia - Possible bacteria CAP Has completed a course of remdesivir - decadron continues- appears to be improving clinically, LFTs climbing, and concern exists for bacterial infection therefore will hold off on actemra or baricitinib - wean O2 as able - continue empiric abx for possible superimposed CAP -assess saturations while exerting himself -possible discharge home 1/4  Recent Labs  Lab 05/07/19 1239 05/07/19 1937 05/08/19 0449 05/09/19 0643 05/10/19 0520 05/10/19 0950 05/11/19 0147 05/12/19 0510  DDIMER  --   --   --   --   --   --  1.23* 0.89*  FERRITIN  --  679*  --   --   --   --  1,307* 807*  CRP  --   --   --   --   --   --  5.9* 3.9*  ALT 106*  --  76* 63* 79*  --  229*  168*  PROCALCITON 0.50 1.06  --   --   --  0.39  --   --     Transaminitis Likely due to Covid versus remdesivir or both -improving -no further remdesivir dosing scheduled  Recent Labs  Lab 05/08/19 0449 05/09/19 0643 05/10/19 0520 05/11/19 0147 05/12/19 0510  AST 44* 41 59* 169* 68*  ALT 76* 63* 79* 229* 168*  ALKPHOS 232* 191* 206* 195* 176*  BILITOT 0.7 0.6 0.6 0.7 0.8  PROT 7.1 7.4 7.1 6.8 6.7  ALBUMIN 3.1* 3.1* 2.9* 3.1* 3.1*     Mediastinal and bilateral hilar adenopathy Possibly related to Covid but not a typical finding -could be related to a bacterial pneumonia - will need to be reevaluated in follow-up once patient has recovered from Covid  GERD Continue home medical therapy  DVT prophylaxis: lovenox Code Status: FULL CODE Family Communication:  Disposition Plan: Med/surge bed -ambulate -check saturations with exertion -possible DC home 1/4 possibly on oxygen support  Consultants:  none  Objective: Blood pressure 115/80, pulse 86, temperature 97.6 F (36.4 C), temperature source Oral, resp. rate 20, height 5\' 10"  (1.778 m), weight 76.1 kg, SpO2 93 %.  Intake/Output Summary (Last 24 hours) at 05/12/2019 0851 Last data filed at 05/12/2019 0827 Gross per 24 hour  Intake 820 ml  Output 2325 ml  Net -1505 ml  Filed Weights   05/10/19 1837  Weight: 76.1 kg    Examination: General: No acute respiratory distress Lungs: fine crackles throughout without significant change Cardiovascular: RRR without murmur or rub Abdomen: Nontender, nondistended, soft, bowel sounds positive, no rebound, no ascites, no appreciable mass Extremities: No edema bilateral lower extremities  CBC: Recent Labs  Lab 05/10/19 0520 05/11/19 0147 05/12/19 0510  WBC 15.3* 10.8* 14.9*  NEUTROABS 12.8* 7.9* 12.1*  HGB 13.8 14.4 14.8  HCT 42.2 44.6 45.3  MCV 89.6 91.0 90.8  PLT 347 379 938   Basic Metabolic Panel: Recent Labs  Lab 05/10/19 0520 05/11/19 0147 05/12/19 0510    NA 140 136 136  K 4.2 4.7 5.2*  CL 103 98 100  CO2 26 26 24   GLUCOSE 131* 135* 135*  BUN 20 24* 22*  CREATININE 0.82 0.90 0.81  CALCIUM 8.8* 8.8* 9.1  MG  --  2.3 2.2   GFR: Estimated Creatinine Clearance: 103.9 mL/min (by C-G formula based on SCr of 0.81 mg/dL).  Liver Function Tests: Recent Labs  Lab 05/09/19 1829 05/10/19 0520 05/11/19 0147 05/12/19 0510  AST 41 59* 169* 68*  ALT 63* 79* 229* 168*  ALKPHOS 191* 206* 195* 176*  BILITOT 0.6 0.6 0.7 0.8  PROT 7.4 7.1 6.8 6.7  ALBUMIN 3.1* 2.9* 3.1* 3.1*    Recent Results (from the past 240 hour(s))  Blood Culture (routine x 2)     Status: None   Collection Time: 05/07/19  2:52 PM   Specimen: BLOOD  Result Value Ref Range Status   Specimen Description BLOOD RIGHT ANTECUBITAL  Final   Special Requests   Final    BOTTLES DRAWN AEROBIC AND ANAEROBIC Blood Culture adequate volume   Culture   Final    NO GROWTH 5 DAYS Performed at [facility], [address] [place], [place] [address]    Report Status 05/12/2019 FINAL  Final  Blood Culture (routine x 2)     Status: None   Collection Time: 05/07/19  2:52 PM   Specimen: BLOOD  Result Value Ref Range Status   Specimen Description BLOOD BLOOD RIGHT HAND  Final   Special Requests   Final    BOTTLES DRAWN AEROBIC AND ANAEROBIC Blood Culture results may not be optimal due to an excessive volume of blood received in culture bottles   Culture   Final    NO GROWTH 5 DAYS Performed at [facility], [address] [place], [place] [address]    Report Status 05/12/2019 FINAL  Final  Respiratory Panel by RT PCR (Flu A&B, Covid) - Nasopharyngeal Swab     Status: Abnormal   Collection Time: 05/10/19  1:05 PM   Specimen: Nasopharyngeal Swab  Result Value Ref Range Status   SARS Coronavirus 2 by RT PCR POSITIVE (A) NEGATIVE Final    Comment: RESULT CALLED TO, READ BACK BY AND VERIFIED WITH: MATTHEW WRIGHT @1427  ON 05/10/2019 BY FMW (NOTE) SARS-CoV-2  target nucleic acids are DETECTED. SARS-CoV-2 RNA is generally detectable in upper respiratory specimens  during the acute phase of infection. Positive results are indicative of the presence of the identified virus, but do not rule out bacterial infection or co-infection with other pathogens not detected by the test. Clinical correlation with patient history and other diagnostic information is necessary to determine patient infection status. The expected result is Negative. Fact Sheet for Patients:  PinkCheek.be Fact Sheet for Healthcare Providers: GravelBags.it This test is not yet approved or cleared by the  [place] [employer] and  has been authorized for detection and/or diagnosis of SARS-CoV-2 by FDA under an TEFL teacher (EUA).  This EUA will remain in effect (meaning this test can be u sed) for the duration of  the COVID-19 declaration under Section 564(b)(1) of the Act, 21 U.S.C. section 360bbb-3(b)(1), unless the authorization is terminated or revoked sooner.    Influenza A by PCR NEGATIVE NEGATIVE Final   Influenza B by PCR NEGATIVE NEGATIVE Final    Comment: (NOTE) The Xpert Xpress SARS-CoV-2/FLU/RSV assay is intended as an aid in  the diagnosis of influenza from Nasopharyngeal swab specimens and  should not be used as a sole basis for treatment. Nasal washings and  aspirates are unacceptable for Xpert Xpress SARS-CoV-2/FLU/RSV  testing. Fact Sheet for Patients: https://www.moore.com/ Fact Sheet for Healthcare Providers: https://www.young.biz/ This test is not yet approved or cleared by the [place] FDA and  has been authorized for detection and/or diagnosis of SARS-CoV-2 by  FDA under an Emergency Use Authorization (EUA). This EUA will remain  in effect (meaning this test can be used) for the duration of the  Covid-19 declaration under Section 564(b)(1) of  the Act, 21  U.S.C. section 360bbb-3(b)(1), unless the authorization is  terminated or revoked. Performed at [facility], [address] [place], [place] [address]   MRSA PCR Screening     Status: None   Collection Time: 05/10/19  6:50 PM   Specimen: Nasopharyngeal  Result Value Ref Range Status   MRSA by PCR NEGATIVE NEGATIVE Final    Comment:        The GeneXpert MRSA Assay (FDA approved for NASAL specimens only), is one component of a comprehensive MRSA colonization surveillance program. It is not intended to diagnose MRSA infection nor to guide or monitor treatment for MRSA infections. Performed at [facility], [address], [place], [place] [address]       LOS: 2 days   Lonia Blood, MD [facility] Office  [phone] Pager - Text Page per Amion  If 7PM-7AM, please contact night-coverage per Amion 05/12/2019, 8:51 AM

## 2019-05-13 LAB — COMPREHENSIVE METABOLIC PANEL
ALT: 146 U/L — ABNORMAL HIGH (ref 0–44)
AST: 51 U/L — ABNORMAL HIGH (ref 15–41)
Albumin: 3.2 g/dL — ABNORMAL LOW (ref 3.5–5.0)
Alkaline Phosphatase: 162 U/L — ABNORMAL HIGH (ref 38–126)
Anion gap: 11 (ref 5–15)
BUN: 22 mg/dL — ABNORMAL HIGH (ref 6–20)
CO2: 26 mmol/L (ref 22–32)
Calcium: 8.9 mg/dL (ref 8.9–10.3)
Chloride: 99 mmol/L (ref 98–111)
Creatinine, Ser: 0.87 mg/dL (ref 0.61–1.24)
GFR calc Af Amer: >60 mL/min (ref >60)
GFR calc non Af Amer: >60 mL/min (ref >60)
Glucose, Bld: 115 mg/dL — ABNORMAL HIGH (ref 70–99)
Potassium: 4.3 mmol/L (ref 3.5–5.1)
Sodium: 136 mmol/L (ref 135–145)
Total Bilirubin: 0.8 mg/dL (ref 0.3–1.2)
Total Protein: 7.2 g/dL (ref 6.5–8.1)

## 2019-05-13 LAB — CBC WITH DIFFERENTIAL/PLATELET
Abs Immature Granulocytes: 1.14 10*3/uL — ABNORMAL HIGH (ref 0.00–0.07)
Basophils Absolute: 0 10*3/uL (ref 0.0–0.1)
Basophils Relative: 0 %
Eosinophils Absolute: 0 10*3/uL (ref 0.0–0.5)
Eosinophils Relative: 0 %
HCT: 44.8 % (ref 39.0–52.0)
Hemoglobin: 14.8 g/dL (ref 13.0–17.0)
Immature Granulocytes: 9 %
Lymphocytes Relative: 7 %
Lymphs Abs: 0.9 10*3/uL (ref 0.7–4.0)
MCH: 29.8 pg (ref 26.0–34.0)
MCHC: 33 g/dL (ref 30.0–36.0)
MCV: 90.1 fL (ref 80.0–100.0)
Monocytes Absolute: 0.5 10*3/uL (ref 0.1–1.0)
Monocytes Relative: 4 %
Neutro Abs: 10.4 10*3/uL — ABNORMAL HIGH (ref 1.7–7.7)
Neutrophils Relative %: 80 %
Platelets: 428 10*3/uL — ABNORMAL HIGH (ref 150–400)
RBC: 4.97 MIL/uL (ref 4.22–5.81)
RDW: 13 % (ref 11.5–15.5)
WBC: 13 10*3/uL — ABNORMAL HIGH (ref 4.0–10.5)
nRBC: 0 % (ref 0.0–0.2)

## 2019-05-13 LAB — FERRITIN: Ferritin: 833 ng/mL — ABNORMAL HIGH (ref 24–336)

## 2019-05-13 LAB — MAGNESIUM: Magnesium: 2.2 mg/dL (ref 1.7–2.4)

## 2019-05-13 LAB — D-DIMER, QUANTITATIVE: D-Dimer, Quant: 0.79 ug/mL-FEU — ABNORMAL HIGH (ref 0.00–0.50)

## 2019-05-13 LAB — C-REACTIVE PROTEIN: CRP: 5.3 mg/dL — ABNORMAL HIGH (ref <1.0)

## 2019-05-13 MED ORDER — CEFDINIR 300 MG PO CAPS: 300.0000 mg | ORAL_CAPSULE | Freq: Two times a day (BID) | ORAL | Status: DC

## 2019-05-13 MED ORDER — CEFDINIR 300 MG PO CAPS: 300.0000 mg | ORAL_CAPSULE | Freq: Two times a day (BID) | ORAL | 0 refills | Status: AC

## 2019-05-13 MED ORDER — CEFDINIR 300 MG PO CAPS
300.0000 mg | ORAL_CAPSULE | Freq: Two times a day (BID) | ORAL | Status: DC
  Administered 2019-05-13: 300 mg via ORAL
  Filled 2019-05-13 (×2): qty 1

## 2019-05-13 MED ORDER — DEXAMETHASONE 6 MG PO TABS: 6.0000 mg | ORAL_TABLET | Freq: Every day | ORAL | 0 refills | Status: AC

## 2019-05-13 NOTE — Plan of Care (Signed)
Discharge

## 2019-05-13 NOTE — Progress Notes (Signed)
SATURATION QUALIFICATIONS: (This note is used to comply with regulatory documentation for home oxygen)  Patient Saturations on Room Air at Rest = 97%  Patient Saturations on Room Air while Ambulating = 93%  Patient Saturations on NA Liters of oxygen while Ambulating = NA%  Please briefly explain why patient needs home oxygen: Pt does not need home oxygen.

## 2019-05-13 NOTE — Evaluation (Signed)
Occupational Therapy Evaluation Patient Details Name: Chad Welch MRN: [id number] DOB: Dec 14, 1960 Today's Date: 05/13/2019    History of Present Illness 59 yo male presenting to [facility] ED on 12/29 with generalized fatigues, fever, and body aches. COVID-19 test positive. No significant PMH.    Clinical Impression   PTA, pt was living with his wife and was independent. Pt currently near baseline level and reports he feels "near my normal." Pt declined performance of ADLs and mobility at this time. Provided handout for energy conservation techniques for ADLs and IADLs; pt verbalized understanding. Provided education on IS exercises and UE theraband exercises. No further acute OT needs. Recommend dc to home once medically stable per physician. Will sign off.    Follow Up Recommendations  No OT follow up    Equipment Recommendations  None recommended by OT    Recommendations for Other Services       Precautions / Restrictions Precautions Precautions: None      Mobility Bed Mobility               General bed mobility comments: In recliner upon arrival  Transfers                 General transfer comment: Pt declined mobility at this time stating he feels at baseline and walked with mobility tech earlier today    Balance                                           ADL either performed or assessed with clinical judgement   ADL Overall ADL's : Needs assistance/impaired                                       General ADL Comments: Pt reporting he performed mobility with mobility tech earlier today. He reports he feels he is at baseline. Provided pt with handout and education on [facility] for ADLs and IADLs. Pt vearblized understanding and ways he plans to impliment into his daily routine.      Vision         Perception     Praxis      Pertinent Vitals/Pain Pain Assessment: No/denies pain     Hand Dominance     Extremity/Trunk  Assessment Upper Extremity Assessment Upper Extremity Assessment: Overall WFL for tasks assessed   Lower Extremity Assessment Lower Extremity Assessment: Overall WFL for tasks assessed   Cervical / Trunk Assessment Cervical / Trunk Assessment: Normal   Communication Communication Communication: No difficulties   Cognition Arousal/Alertness: Awake/alert Behavior During Therapy: WFL for tasks assessed/performed Overall Cognitive Status: Within Functional Limits for tasks assessed                                     General Comments  VSS on RA    Exercises Exercises: Other exercises Other Exercises Other Exercises: Provided handout on EC for ADLs Other Exercises: Reviewed use of IS and pt verbalized understanding Other Exercises: Provided theraband and pt verabalized understanding for UE exercises   Shoulder Instructions      Home Living Family/patient expects to be discharged to:: Private residence Living Arrangements: Spouse/significant other Available Help at Discharge: Family;Available 24 hours/day Type of Home: House Home Access:  Stairs to enter [employer] of Steps: 2   Home Layout: One level     Bathroom Shower/Tub: Producer, television/film/video: Standard     Home Equipment: Information systems manager - built in          Prior Functioning/Environment Level of Independence: Independent                 OT Problem List: Decreased activity tolerance;Decreased knowledge of use of DME or AE;Decreased knowledge of precautions      OT Treatment/Interventions:      OT Goals(Current goals can be found in the care plan section) Acute Rehab OT Goals Patient Stated Goal: Go home today OT Goal Formulation: All assessment and education complete, DC therapy  OT Frequency:     Barriers to D/C:            Co-evaluation              AM-PAC OT "6 Clicks" Daily Activity     Outcome Measure Help from another person eating meals?:  None Help from another person taking care of personal grooming?: None Help from another person toileting, which includes using toliet, bedpan, or urinal?: None Help from another person bathing (including washing, rinsing, drying)?: None Help from another person to put on and taking off regular upper body clothing?: None Help from another person to put on and taking off regular lower body clothing?: None 6 Click Score: 24   End of Session Nurse Communication: Mobility status  Activity Tolerance: Patient tolerated treatment well Patient left: in chair;with call bell/phone within reach;with nursing/sitter in room  OT Visit Diagnosis: Muscle weakness (generalized) (M62.81)                Time: 5027-7412 OT Time Calculation (min): 15 min Charges:  OT General Charges $OT Visit: 1 Visit OT Evaluation $OT Eval Low Complexity: 1 Low  Panda Crossin MSOT, OTR/L Acute Rehab Pager: [phone] Office: [phone]  Theodoro Grist Lindaann Gradilla 05/13/2019, 1:40 PM

## 2019-05-13 NOTE — Discharge Instructions (Signed)
Date of Positive COVID Test: 12/22  Date Quarantine Ends: 05/21/2019    COVID-19 COVID-19 is a respiratory infection that is caused by a virus called severe acute respiratory syndrome coronavirus 2 (SARS-CoV-2). The disease is also known as coronavirus disease or novel coronavirus. In some people, the virus may not cause any symptoms. In others, it may cause a serious infection. The infection can get worse quickly and can lead to complications, such as:  Pneumonia, or infection of the lungs.  Acute respiratory distress syndrome or ARDS. This is a condition in which fluid build-up in the lungs prevents the lungs from filling with air and passing oxygen into the blood.  Acute respiratory failure. This is a condition in which there is not enough oxygen passing from the lungs to the body or when carbon dioxide is not passing from the lungs out of the body.  Sepsis or septic shock. This is a serious bodily reaction to an infection.  Blood clotting problems.  Secondary infections due to bacteria or fungus.  Organ failure. This is when your body's organs stop working. The virus that causes COVID-19 is contagious. This means that it can spread from person to person through droplets from coughs and sneezes (respiratory secretions). What are the causes? This illness is caused by a virus. You may catch the virus by:  Breathing in droplets from an infected person. Droplets can be spread by a person breathing, speaking, singing, coughing, or sneezing.  Touching something, like a table or a doorknob, that was exposed to the virus (contaminated) and then touching your mouth, nose, or eyes. What increases the risk? Risk for infection You are more likely to be infected with this virus if you:  Are within 6 feet (2 meters) of a person with COVID-19.  Provide care for or live with a person who is infected with COVID-19.  Spend time in crowded indoor spaces or live in shared housing. Risk for  serious illness You are more likely to become seriously ill from the virus if you:  Are 51 years of age or older. The higher your age, the more you are at risk for serious illness.  Live in a nursing home or long-term care facility.  Have cancer.  Have a long-term (chronic) disease such as: ? Chronic lung disease, including chronic obstructive pulmonary disease or asthma. ? A long-term disease that lowers your body's ability to fight infection (immunocompromised). ? Heart disease, including heart failure, a condition in which the arteries that lead to the heart become narrow or blocked (coronary artery disease), a disease which makes the heart muscle thick, weak, or stiff (cardiomyopathy). ? Diabetes. ? Chronic kidney disease. ? Sickle cell disease, a condition in which red blood cells have an abnormal "sickle" shape. ? Liver disease.  Are obese. What are the signs or symptoms? Symptoms of this condition can range from mild to severe. Symptoms may appear any time from 2 to 14 days after being exposed to the virus. They include:  A fever or chills.  A cough.  Difficulty breathing.  Headaches, body aches, or muscle aches.  Runny or stuffy (congested) nose.  A sore throat.  New loss of taste or smell. Some people may also have stomach problems, such as nausea, vomiting, or diarrhea. Other people may not have any symptoms of COVID-19. How is this diagnosed? This condition may be diagnosed based on:  Your signs and symptoms, especially if: ? You live in an area with a COVID-19 outbreak. ? You  recently traveled to or from an area where the virus is common. ? You provide care for or live with a person who was diagnosed with COVID-19. ? You were exposed to a person who was diagnosed with COVID-19.  A physical exam.  Lab tests, which may include: ? Taking a sample of fluid from the back of your nose and throat (nasopharyngeal fluid), your nose, or your throat using a  swab. ? A sample of mucus from your lungs (sputum). ? Blood tests.  Imaging tests, which may include, X-rays, CT scan, or ultrasound. How is this treated? At present, there is no medicine to treat COVID-19. Medicines that treat other diseases are being used on a trial basis to see if they are effective against COVID-19. Your health care provider will talk with you about ways to treat your symptoms. For most people, the infection is mild and can be managed at home with rest, fluids, and over-the-counter medicines. Treatment for a serious infection usually takes places in a hospital intensive care unit (ICU). It may include one or more of the following treatments. These treatments are given until your symptoms improve.  Receiving fluids and medicines through an IV.  Supplemental oxygen. Extra oxygen is given through a tube in the nose, a face mask, or a hood.  Positioning you to lie on your stomach (prone position). This makes it easier for oxygen to get into the lungs.  Continuous positive airway pressure (CPAP) or bi-level positive airway pressure (BPAP) machine. This treatment uses mild air pressure to keep the airways open. A tube that is connected to a motor delivers oxygen to the body.  Ventilator. This treatment moves air into and out of the lungs by using a tube that is placed in your windpipe.  Tracheostomy. This is a procedure to create a hole in the neck so that a breathing tube can be inserted.  Extracorporeal membrane oxygenation (ECMO). This procedure gives the lungs a chance to recover by taking over the functions of the heart and lungs. It supplies oxygen to the body and removes carbon dioxide. Follow these instructions at home: Lifestyle  If you are sick, stay home except to get medical care. Your health care provider will tell you how long to stay home. Call your health care provider before you go for medical care.  Rest at home as told by your health care provider.  Do  not use any products that contain nicotine or tobacco, such as cigarettes, e-cigarettes, and chewing tobacco. If you need help quitting, ask your health care provider.  Return to your normal activities as told by your health care provider. Ask your health care provider what activities are safe for you. General instructions  Take over-the-counter and prescription medicines only as told by your health care provider.  Drink enough fluid to keep your urine pale yellow.  Keep all follow-up visits as told by your health care provider. This is important. How is this prevented?  There is no vaccine to help prevent COVID-19 infection. However, there are steps you can take to protect yourself and others from this virus. To protect yourself:   Do not travel to areas where COVID-19 is a risk. The areas where COVID-19 is reported change often. To identify high-risk areas and travel restrictions, check the CDC travel website: FatFares.com.br  If you live in, or must travel to, an area where COVID-19 is a risk, take precautions to avoid infection. ? Stay away from people who are sick. ? Wash  your hands often with soap and water for 20 seconds. If soap and water are not available, use an alcohol-based hand sanitizer. ? Avoid touching your mouth, face, eyes, or nose. ? Avoid going out in public, follow guidance from your state and local health authorities. ? If you must go out in public, wear a cloth face covering or face mask. Make sure your mask covers your nose and mouth. ? Avoid crowded indoor spaces. Stay at least 6 feet (2 meters) away from others. ? Disinfect objects and surfaces that are frequently touched every day. This may include:  Counters and tables.  Doorknobs and light switches.  Sinks and faucets.  Electronics, such as phones, remote controls, keyboards, computers, and tablets. To protect others: If you have symptoms of COVID-19, take steps to prevent the virus from  spreading to others.  If you think you have a COVID-19 infection, contact your health care provider right away. Tell your health care team that you think you may have a COVID-19 infection.  Stay home. Leave your house only to seek medical care. Do not use public transport.  Do not travel while you are sick.  Wash your hands often with soap and water for 20 seconds. If soap and water are not available, use alcohol-based hand sanitizer.  Stay away from other members of your household. Let healthy household members care for children and pets, if possible. If you have to care for children or pets, wash your hands often and wear a mask. If possible, stay in your own room, separate from others. Use a different bathroom.  Make sure that all people in your household wash their hands well and often.  Cough or sneeze into a tissue or your sleeve or elbow. Do not cough or sneeze into your hand or into the air.  Wear a cloth face covering or face mask. Make sure your mask covers your nose and mouth. Where to find more information  Centers for Disease Control and Prevention: PurpleGadgets.be  World Health Organization: https://www.castaneda.info/ Contact a health care provider if:  You live in or have traveled to an area where COVID-19 is a risk and you have symptoms of the infection.  You have had contact with someone who has COVID-19 and you have symptoms of the infection. Get help right away if:  You have trouble breathing.  You have pain or pressure in your chest.  You have confusion.  You have bluish lips and fingernails.  You have difficulty waking from sleep.  You have symptoms that get worse. These symptoms may represent a serious problem that is an emergency. Do not wait to see if the symptoms will go away. Get medical help right away. Call your local emergency services (911 in the [place] Do not drive yourself to the hospital. Let the  emergency medical personnel know if you think you have COVID-19. Summary  COVID-19 is a respiratory infection that is caused by a virus. It is also known as coronavirus disease or novel coronavirus. It can cause serious infections, such as pneumonia, acute respiratory distress syndrome, acute respiratory failure, or sepsis.  The virus that causes COVID-19 is contagious. This means that it can spread from person to person through droplets from breathing, speaking, singing, coughing, or sneezing.  You are more likely to develop a serious illness if you are 25 years of age or older, have a weak immune system, live in a nursing home, or have chronic disease.  There is no medicine to treat COVID-19.  Your health care provider will talk with you about ways to treat your symptoms.  Take steps to protect yourself and others from infection. Wash your hands often and disinfect objects and surfaces that are frequently touched every day. Stay away from people who are sick and wear a mask if you are sick. This information is not intended to replace advice given to you by your health care provider. Make sure you discuss any questions you have with your health care provider. Document Revised: 02/22/2019 Document Reviewed: 05/31/2018 Elsevier Patient Education  2020 [place].  COVID-19: How to Protect Yourself and Others Know how it spreads  There is currently no vaccine to prevent coronavirus disease 2019 (COVID-19).  The best way to prevent illness is to avoid being exposed to this virus.  The virus is thought to spread mainly from person-to-person. ? Between people who are in close contact with one another (within about 6 feet). ? Through respiratory droplets produced when an infected person coughs, sneezes or talks. ? These droplets can land in the mouths or noses of people who are nearby or possibly be inhaled into the lungs. ? COVID-19 may be spread by people who are not showing  symptoms. Everyone should Clean your hands often  Wash your hands often with soap and water for at least 20 seconds especially after you have been in a public place, or after blowing your nose, coughing, or sneezing.  If soap and water are not readily available, use a hand sanitizer that contains at least 60% alcohol. Cover all surfaces of your hands and rub them together until they feel dry.  Avoid touching your eyes, nose, and mouth with unwashed hands. Avoid close contact  Limit contact with others as much as possible.  Avoid close contact with people who are sick.  Put distance between yourself and other people. ? Remember that some people without symptoms may be able to spread virus. ? This is especially important for people who are at higher risk of getting very GainPain.com.cy Cover your mouth and nose with a mask when around others  You could spread COVID-19 to others even if you do not feel sick.  Everyone should wear a mask in public settings and when around people not living in their household, especially when social distancing is difficult to maintain. ? Masks should not be placed on young children under age 6, anyone who has trouble breathing, or is unconscious, incapacitated or otherwise unable to remove the mask without assistance.  The mask is meant to protect other people in case you are infected.  Do NOT use a facemask meant for a Dietitian.  Continue to keep about 6 feet between yourself and others. The mask is not a substitute for social distancing. Cover coughs and sneezes  Always cover your mouth and nose with a tissue when you cough or sneeze or use the inside of your elbow.  Throw used tissues in the trash.  Immediately wash your hands with soap and water for at least 20 seconds. If soap and water are not readily available, clean your hands with a hand sanitizer that contains  at least 60% alcohol. Clean and disinfect  Clean AND disinfect frequently touched surfaces daily. This includes tables, doorknobs, light switches, countertops, handles, desks, phones, keyboards, toilets, faucets, and sinks. RackRewards.fr  If surfaces are dirty, clean them: Use detergent or soap and water prior to disinfection.  Then, use a household disinfectant. You can see a list of EPA-registered household disinfectants  here. michellinders.com 01/09/2019 This information is not intended to replace advice given to you by your health care provider. Make sure you discuss any questions you have with your health care provider. Document Revised: 01/17/2019 Document Reviewed: 11/15/2018 Elsevier Patient Education  [place].   COVID-19 Frequently Asked Questions COVID-19 (coronavirus disease) is an infection that is caused by a large family of viruses. Some viruses cause illness in people and others cause illness in animals like camels, cats, and bats. In some cases, the viruses that cause illness in animals can spread to humans. Where did the coronavirus come from? In December 2019, [place] told the [facility]) of several cases of lung disease (human respiratory illness). These cases were linked to an open seafood and livestock market in the city of [place]. The link to the seafood and livestock market suggests that the virus may have spread from animals to humans. However, since that first outbreak in December, the virus has also been shown to spread from person to person. What is the name of the disease and the virus? Disease name Early on, this disease was called novel coronavirus. This is because scientists determined that the disease was caused by a new (novel) respiratory virus. The World Health Organization [facility]) has now named the disease COVID-19, or coronavirus disease. Virus name The virus  that causes the disease is called severe acute respiratory syndrome coronavirus 2 (SARS-CoV-2). More information on disease and virus naming World Health Organization [facility]): www.who.int/emergencies/diseases/novel-coronavirus-2019/technical-guidance/naming-the-coronavirus-disease-(covid-2019)-and-the-virus-that-causes-it Who is at risk for complications from coronavirus disease? Some people may be at higher risk for complications from coronavirus disease. This includes older adults and people who have chronic diseases, such as heart disease, diabetes, and lung disease. If you are at higher risk for complications, take these extra precautions:  Stay home as much as possible.  Avoid social gatherings and travel.  Avoid close contact with others. Stay at least 6 ft (2 m) away from others, if possible.  Wash your hands often with soap and water for at least 20 seconds.  Avoid touching your face, mouth, nose, or eyes.  Keep supplies on hand at home, such as food, medicine, and cleaning supplies.  If you must go out in public, wear a cloth face covering or face mask. Make sure your mask covers your nose and mouth. How does coronavirus disease spread? The virus that causes coronavirus disease spreads easily from person to person (is contagious). You may catch the virus by:  Breathing in droplets from an infected person. Droplets can be spread by a person breathing, speaking, singing, coughing, or sneezing.  Touching something, like a table or a doorknob, that was exposed to the virus (contaminated) and then touching your mouth, nose, or eyes. Can I get the virus from touching surfaces or objects? There is still a lot that we do not know about the virus that causes coronavirus disease. Scientists are basing a lot of information on what they know about similar viruses, such as:  Viruses cannot generally survive on surfaces for long. They need a human body (host) to survive.  It is more likely  that the virus is spread by close contact with people who are sick (direct contact), such as through: ? Shaking hands or hugging. ? Breathing in respiratory droplets that travel through the air. Droplets can be spread by a person breathing, speaking, singing, coughing, or sneezing.  It is less likely that the virus is spread when a person touches a surface or object  that has the virus on it (indirect contact). The virus may be able to enter the body if the person touches a surface or object and then touches his or her face, eyes, nose, or mouth. Can a person spread the virus without having symptoms of the disease? It may be possible for the virus to spread before a person has symptoms of the disease, but this is most likely not the main way the virus is spreading. It is more likely for the virus to spread by being in close contact with people who are sick and breathing in the respiratory droplets spread by a person breathing, speaking, singing, coughing, or sneezing. What are the symptoms of coronavirus disease? Symptoms vary from person to person and can range from mild to severe. Symptoms may include:  Fever or chills.  Cough.  Difficulty breathing or feeling short of breath.  Headaches, body aches, or muscle aches.  Runny or stuffy (congested) nose.  Sore throat.  New loss of taste or smell.  Nausea, vomiting, or diarrhea. These symptoms can appear anywhere from 2 to 14 days after you have been exposed to the virus. Some people may not have any symptoms. If you develop symptoms, call your health care provider. People with severe symptoms may need hospital care. Should I be tested for this virus? Your health care provider will decide whether to test you based on your symptoms, history of exposure, and your risk factors. How does a health care provider test for this virus? Health care providers will collect samples to send for testing. Samples may include:  Taking a swab of fluid from  the back of your nose and throat, your nose, or your throat.  Taking fluid from the lungs by having you cough up mucus (sputum) into a sterile cup.  Taking a blood sample. Is there a treatment or vaccine for this virus? Currently, there is no vaccine to prevent coronavirus disease. Also, there are no medicines like antibiotics or antivirals to treat the virus. A person who becomes sick is given supportive care, which means rest and fluids. A person may also relieve his or her symptoms by using over-the-counter medicines that treat sneezing, coughing, and runny nose. These are the same medicines that a person takes for the common cold. If you develop symptoms, call your health care provider. People with severe symptoms may need hospital care. What can I do to protect myself and my family from this virus?     You can protect yourself and your family by taking the same actions that you would take to prevent the spread of other viruses. Take the following actions:  Wash your hands often with soap and water for at least 20 seconds. If soap and water are not available, use alcohol-based hand sanitizer.  Avoid touching your face, mouth, nose, or eyes.  Cough or sneeze into a tissue, sleeve, or elbow. Do not cough or sneeze into your hand or the air. ? If you cough or sneeze into a tissue, throw it away immediately and wash your hands.  Disinfect objects and surfaces that you frequently touch every day.  Stay away from people who are sick.  Avoid going out in public, follow guidance from your state and local health authorities.  Avoid crowded indoor spaces. Stay at least 6 ft (2 m) away from others.  If you must go out in public, wear a cloth face covering or face mask. Make sure your mask covers your nose and mouth.  Stay  home if you are sick, except to get medical care. Call your health care provider before you get medical care. Your health care provider will tell you how long to stay  home.  Make sure your vaccines are up to date. Ask your health care provider what vaccines you need. What should I do if I need to travel? Follow travel recommendations from your local health authority, the CDC, and WHO. Travel information and advice  Centers for Disease Control and Prevention (CDC): BodyEditor.hu  World Health Organization [facility]): ThirdIncome.ca Know the risks and take action to protect your health  You are at higher risk of getting coronavirus disease if you are traveling to areas with an outbreak or if you are exposed to travelers from areas with an outbreak.  Wash your hands often and practice good hygiene to lower the risk of catching or spreading the virus. What should I do if I am sick? General instructions to stop the spread of infection  Wash your hands often with soap and water for at least 20 seconds. If soap and water are not available, use alcohol-based hand sanitizer.  Cough or sneeze into a tissue, sleeve, or elbow. Do not cough or sneeze into your hand or the air.  If you cough or sneeze into a tissue, throw it away immediately and wash your hands.  Stay home unless you must get medical care. Call your health care provider or local health authority before you get medical care.  Avoid public areas. Do not take public transportation, if possible.  If you can, wear a mask if you must go out of the house or if you are in close contact with someone who is not sick. Make sure your mask covers your nose and mouth. Keep your home clean  Disinfect objects and surfaces that are frequently touched every day. This may include: ? Counters and tables. ? Doorknobs and light switches. ? Sinks and faucets. ? Electronics such as phones, remote controls, keyboards, computers, and tablets.  Wash dishes in hot, soapy water or use a dishwasher. Air-dry your dishes.  Wash  laundry in hot water. Prevent infecting other household members  Let healthy household members care for children and pets, if possible. If you have to care for children or pets, wash your hands often and wear a mask.  Sleep in a different bedroom or bed, if possible.  Do not share personal items, such as razors, toothbrushes, deodorant, combs, brushes, towels, and washcloths. Where to find more information Centers for Disease Control and Prevention (CDC)  Information and news updates: https://www.butler-gonzalez.com/ World Health Organization [facility])  Information and news updates: MissExecutive.com.ee  Coronavirus health topic: https://www.castaneda.info/  Questions and answers on COVID-19: OpportunityDebt.at  Global tracker: who.sprinklr.com American Academy of Pediatrics (AAP)  Information for families: www.healthychildren.org/English/health-issues/conditions/chest-lungs/Pages/2019-Novel-Coronavirus.aspx The coronavirus situation is changing rapidly. Check your local health authority website or the CDC and [facility] websites for updates and news. When should I contact a health care provider?  Contact your health care provider if you have symptoms of an infection, such as fever or cough, and you: ? Have been near anyone who is known to have coronavirus disease. ? Have come into contact with a person who is suspected to have coronavirus disease. ? Have traveled to an area where there is an outbreak of COVID-19. When should I get emergency medical care?  Get help right away by calling your local emergency services (911 in the [place] if you have: ? Trouble breathing. ? Pain or pressure  in your chest. ? Confusion. ? Blue-tinged lips and fingernails. ? Difficulty waking from sleep. ? Symptoms that get worse. Let the emergency medical personnel know if you think you have coronavirus disease. Summary  A new  respiratory virus is spreading from person to person and causing COVID-19 (coronavirus disease).  The virus that causes COVID-19 appears to spread easily. It spreads from one person to another through droplets from breathing, speaking, singing, coughing, or sneezing.  Older adults and those with chronic diseases are at higher risk of disease. If you are at higher risk for complications, take extra precautions.  There is currently no vaccine to prevent coronavirus disease. There are no medicines, such as antibiotics or antivirals, to treat the virus.  You can protect yourself and your family by washing your hands often, avoiding touching your face, and covering your coughs and sneezes. This information is not intended to replace advice given to you by your health care provider. Make sure you discuss any questions you have with your health care provider. Document Revised: 02/22/2019 Document Reviewed: 08/21/2018 Elsevier Patient Education  [place].

## 2019-05-13 NOTE — Discharge Summary (Signed)
DISCHARGE SUMMARY  Chad Welch  MR#: [id number]  DOB:Dec 05, 1960  Date of Admission: 05/10/2019 Date of Discharge: 05/13/2019  Attending Physician:Duc Crocket Silvestre Gunner, MD  Patient's NWG:NFAOZ, Theressa Millard, PA  Consults: none   Disposition: D/C home   Date of Positive COVID Test: 04/30/19  Date Quarantine Ends: 05/21/2019  COVID-19 specific Treatment: Remdesivir 12/29 > 1/02 Decadron 12/29 > 1/7  Follow-up Appts: Follow-up Information    Leslye Peer, PA Follow up in 1 week(s).   Specialty: Family Medicine Contact information: [address] [facility] [place] [address] [phone]           Tests Needing Follow-up: - suggest f/u of LFTs in 1 week  - repeat imaging of chest in 3-4 weeks once fully recovered from PNA suggested to f/u on mediastinal and hilar lymphadenopathy   Discharge Diagnoses: Covid pneumonia Acute hypoxic respiratory failure  Bacterial CAP Transaminitis Mediastinal and bilateral hilar adenopathy GERD  Initial presentation: 59 year old with no significant past medical history who presented to the ED at [facility] 12/29 with complaints of generalized fatigue, fever, and body aches of approximately 1 weeks duration.  He reported exposure to many Covid positive individuals at his work. He originally presented to a local urgent care was found to be Covid positive and hypoxic, and sent to the ED.  In the ED CXR noted bilateral infiltrates.  Hospital Course: 12/29 admit to [facility] via [facility] ED 12/30 CTa negative for pulmonary embolism -weaned to room air 12/31 oxygen requirement increased to 3 L nasal cannula 1/1 oxygen requirement increased to 8 L high flow nasal cannula -transfer to [employer] 1/4 d/c home   Covid pneumonia - Possible bacteria CAP Has completed a course of remdesivir - decadron to continue for 10 days total - due to LFTs climbing and concern for bacterial infection he was not dosed w/ actemra or  baricitinib - wean O2 as able - continue empiric abx for possible superimposed CAP w/ conversion to oral meds at d/c - stable for d/c home 1/5 w/o need for O2 support   Transaminitis Likely due to Covid versus remdesivir or both -improved signif at time of d/c -no further remdesivir dosing scheduled - suggest f/u of LFTs in 1 week   Recent Labs  Lab 05/09/19 0643 05/10/19 0520 05/11/19 0147 05/12/19 0510 05/13/19 0730  AST 41 59* 169* 68* 51*  ALT 63* 79* 229* 168* 146*  ALKPHOS 191* 206* 195* 176* 162*  BILITOT 0.6 0.6 0.7 0.8 0.8  PROT 7.4 7.1 6.8 6.7 7.2  ALBUMIN 3.1* 2.9* 3.1* 3.1* 3.2*    Mediastinal and bilateral hilar adenopathy Possibly related to Covid but not a typical finding for Covid PNA - could be related to a suspected bacterial pneumonia - will need to be re-evaluated in follow-up once patient has recovered from Covid  GERD Continue home medical therapy prn  Allergies as of 05/13/2019   No Known Allergies     Medication List    STOP taking these medications   azithromycin 500 mg in sodium chloride 0.9 % 250 mL   cefTRIAXone 1 g in sodium chloride 0.9 % 100 mL     TAKE these medications   acetaminophen 500 MG tablet Commonly known as: TYLENOL Take 500-1,000 mg by mouth every 6 (six) hours as needed for moderate pain or fever.   cefdinir 300 MG capsule Commonly known as: OMNICEF Take 1 capsule (300 mg total) by mouth every 12 (twelve) hours for 3 days. Start taking  on: May 14, 2019   dexamethasone 6 MG tablet Commonly known as: Decadron Take 1 tablet (6 mg total) by mouth daily for 3 days. Start taking on: May 14, 2019       Day of Discharge BP 96/74 (BP Location: Right Arm)   Pulse 76   Temp 98 F (36.7 C) (Oral)   Resp 17   Ht 5\' 10"  (1.778 m)   Wt 76.1 kg   SpO2 100%   BMI 24.07 kg/m   Physical Exam: General: No acute respiratory distress Lungs: Clear to auscultation bilaterally without wheezes or crackles Cardiovascular:  Regular rate and rhythm without murmur gallop or rub normal S1 and S2 Abdomen: Nontender, nondistended, soft, bowel sounds positive, no rebound, no ascites, no appreciable mass Extremities: No significant cyanosis, clubbing, or edema bilateral lower extremities  Basic Metabolic Panel: Recent Labs  Lab 05/09/19 0643 05/10/19 0520 05/11/19 0147 05/12/19 0510 05/13/19 0730  NA 140 140 136 136 136  K 4.4 4.2 4.7 5.2* 4.3  CL 103 103 98 100 99  CO2 28 26 26 24 26   GLUCOSE 150* 131* 135* 135* 115*  BUN 20 20 24* 22* 22*  CREATININE 0.83 0.82 0.90 0.81 0.87  CALCIUM 8.8* 8.8* 8.8* 9.1 8.9  MG  --   --  2.3 2.2 2.2    Liver Function Tests: Recent Labs  Lab 05/09/19 0643 05/10/19 0520 05/11/19 0147 05/12/19 0510 05/13/19 0730  AST 41 59* 169* 68* 51*  ALT 63* 79* 229* 168* 146*  ALKPHOS 191* 206* 195* 176* 162*  BILITOT 0.6 0.6 0.7 0.8 0.8  PROT 7.4 7.1 6.8 6.7 7.2  ALBUMIN 3.1* 2.9* 3.1* 3.1* 3.2*    CBC: Recent Labs  Lab 05/09/19 0643 05/10/19 0520 05/11/19 0147 05/12/19 0510 05/13/19 0730  WBC 18.1* 15.3* 10.8* 14.9* 13.0*  NEUTROABS 16.3* 12.8* 7.9* 12.1* 10.4*  HGB 13.9 13.8 14.4 14.8 14.8  HCT 42.7 42.2 44.6 45.3 44.8  MCV 90.7 89.6 91.0 90.8 90.1  PLT 306 347 379 390 428*    BNP (last 3 results) Recent Labs    05/07/19 1452  BNP 237.0*    Recent Results (from the past 240 hour(s))  Blood Culture (routine x 2)     Status: None   Collection Time: 05/07/19  2:52 PM   Specimen: BLOOD  Result Value Ref Range Status   Specimen Description BLOOD RIGHT ANTECUBITAL  Final   Special Requests   Final    BOTTLES DRAWN AEROBIC AND ANAEROBIC Blood Culture adequate volume   Culture   Final    NO GROWTH 5 DAYS Performed at [facility], [address] [place], [address] [place]    Report Status 05/12/2019 FINAL  Final  Blood Culture (routine x 2)     Status: None   Collection Time: 05/07/19  2:52 PM   Specimen: BLOOD  Result Value Ref Range  Status   Specimen Description BLOOD BLOOD RIGHT HAND  Final   Special Requests   Final    BOTTLES DRAWN AEROBIC AND ANAEROBIC Blood Culture results may not be optimal due to an excessive volume of blood received in culture bottles   Culture   Final    NO GROWTH 5 DAYS Performed at [facility], [address] [place], [address] [place]    Report Status 05/12/2019 FINAL  Final  Respiratory Panel by RT PCR (Flu A&B, Covid) - Nasopharyngeal Swab     Status: Abnormal   Collection Time: 05/10/19  1:05  PM   Specimen: Nasopharyngeal Swab  Result Value Ref Range Status   SARS Coronavirus 2 by RT PCR POSITIVE (A) NEGATIVE Final    Comment: RESULT CALLED TO, READ BACK BY AND VERIFIED WITH: MATTHEW WRIGHT @1427  ON 05/10/2019 BY FMW (NOTE) SARS-CoV-2 target nucleic acids are DETECTED. SARS-CoV-2 RNA is generally detectable in upper respiratory specimens  during the acute phase of infection. Positive results are indicative of the presence of the identified virus, but do not rule out bacterial infection or co-infection with other pathogens not detected by the test. Clinical correlation with patient history and other diagnostic information is necessary to determine patient infection status. The expected result is Negative. Fact Sheet for Patients:  PinkCheek.be Fact Sheet for Healthcare Providers: GravelBags.it This test is not yet approved or cleared by the [place] FDA and  has been authorized for detection and/or diagnosis of SARS-CoV-2 by FDA under an Emergency Use Authorization (EUA).  This EUA will remain in effect (meaning this test can be u sed) for the duration of  the COVID-19 declaration under Section 564(b)(1) of the Act, 21 U.S.C. section 360bbb-3(b)(1), unless the authorization is terminated or revoked sooner.    Influenza A by PCR NEGATIVE NEGATIVE Final   Influenza B by PCR NEGATIVE NEGATIVE Final     Comment: (NOTE) The Xpert Xpress SARS-CoV-2/FLU/RSV assay is intended as an aid in  the diagnosis of influenza from Nasopharyngeal swab specimens and  should not be used as a sole basis for treatment. Nasal washings and  aspirates are unacceptable for Xpert Xpress SARS-CoV-2/FLU/RSV  testing. Fact Sheet for Patients: PinkCheek.be Fact Sheet for Healthcare Providers: GravelBags.it This test is not yet approved or cleared by the [place] FDA and  has been authorized for detection and/or diagnosis of SARS-CoV-2 by  FDA under an Emergency Use Authorization (EUA). This EUA will remain  in effect (meaning this test can be used) for the duration of the  Covid-19 declaration under Section 564(b)(1) of the Act, 21  U.S.C. section 360bbb-3(b)(1), unless the authorization is  terminated or revoked. Performed at [facility], [address] [place], [place] [address]   MRSA PCR Screening     Status: None   Collection Time: 05/10/19  6:50 PM   Specimen: Nasopharyngeal  Result Value Ref Range Status   MRSA by PCR NEGATIVE NEGATIVE Final    Comment:        The GeneXpert MRSA Assay (FDA approved for NASAL specimens only), is one component of a comprehensive MRSA colonization surveillance program. It is not intended to diagnose MRSA infection nor to guide or monitor treatment for MRSA infections. Performed at [facility], [place] [address] [place], [place] [address]      Time spent in discharge (includes decision making & examination of pt): 35 minutes  05/13/2019, 3:09 PM   Cherene Altes, MD [facility] Office  [phone]

## 2019-07-08 ENCOUNTER — Other Ambulatory Visit: Payer: Self-pay

## 2020-07-01 IMAGING — CT CT ANGIO CHEST
2 of 6 series · 18 of 46 positions shown · IV contrast (APPLIED)
Comparison: Chest x-ray from same day.

CLINICAL DATA: Shortness of breath.  COVID positive.

EXAM:
CT ANGIOGRAPHY CHEST WITH CONTRAST
TECHNIQUE: Multidetector CT imaging of the chest was performed using the
standard protocol during bolus administration of intravenous
contrast. Multiplanar CT image reconstructions and MIPs were
obtained to evaluate the vascular anatomy.
CONTRAST:  75mL OMNIPAQUE IOHEXOL 350 MG/ML SOLN

[Series 5: thins · axial · 0.74mm/px · z∈[-342,-64]mm · 15 of 306 slices shown]
[im 14/306  lung]
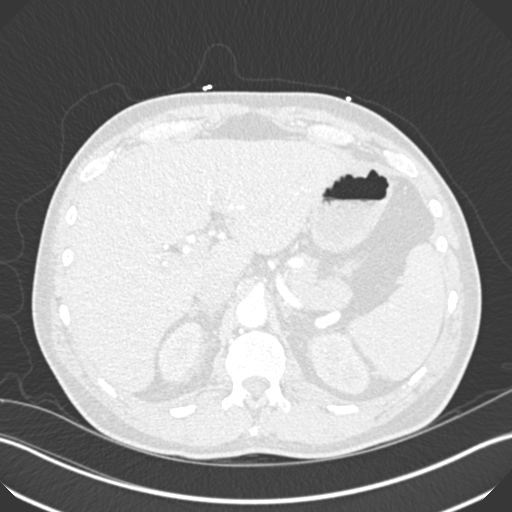
[im 40/306  soft-tissue]
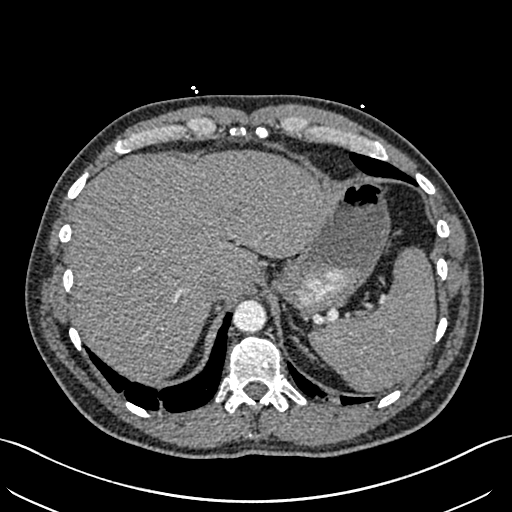
[im 54/306  lung]
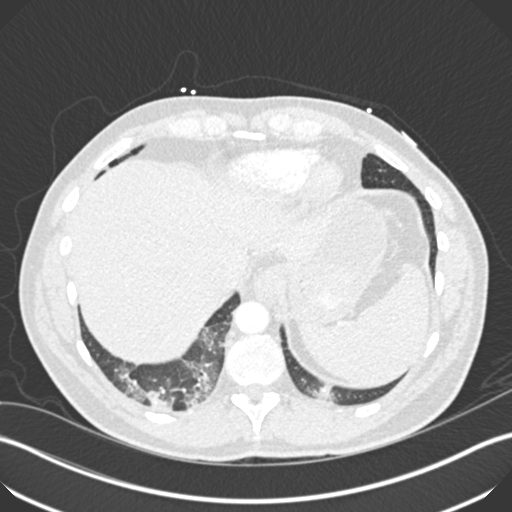
[im 80/306  soft-tissue]
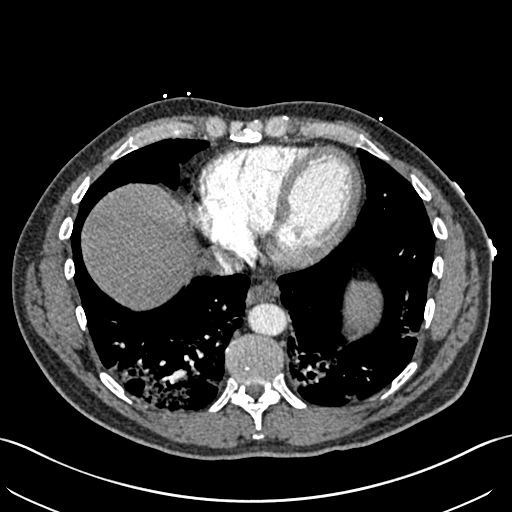
[im 93/306  lung]
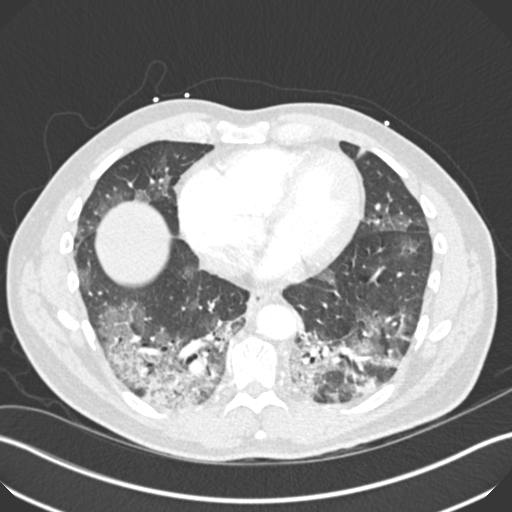
[im 120/306  soft-tissue]
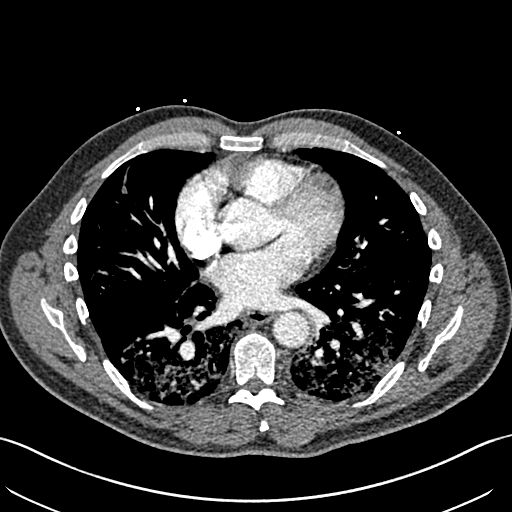
[im 133/306  lung]
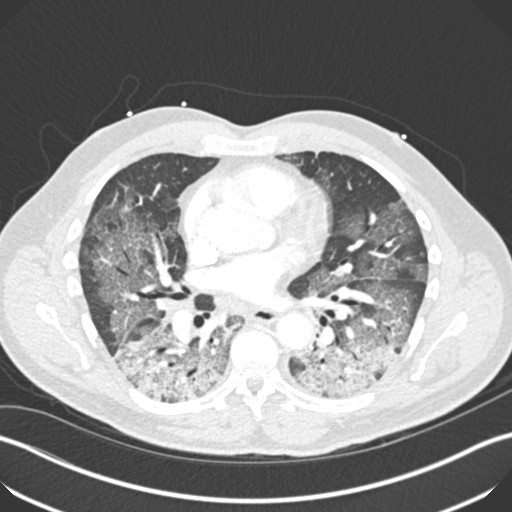
[im 160/306  soft-tissue]
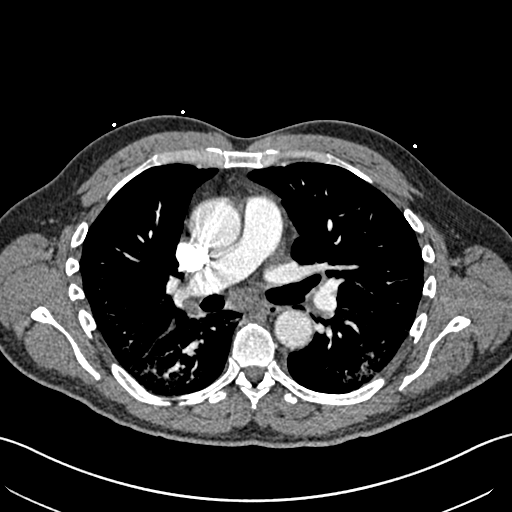
[im 173/306  lung]
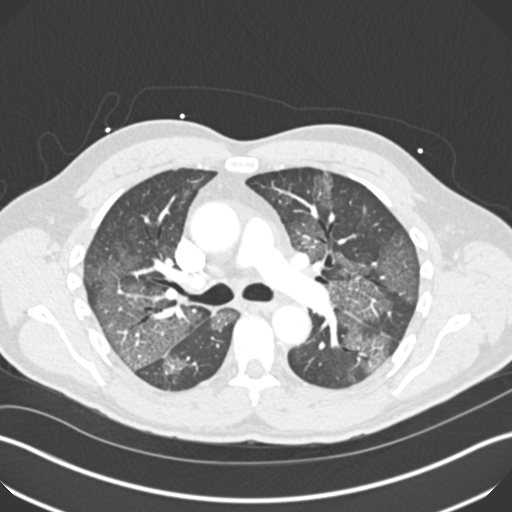
[im 186/306  soft-tissue]
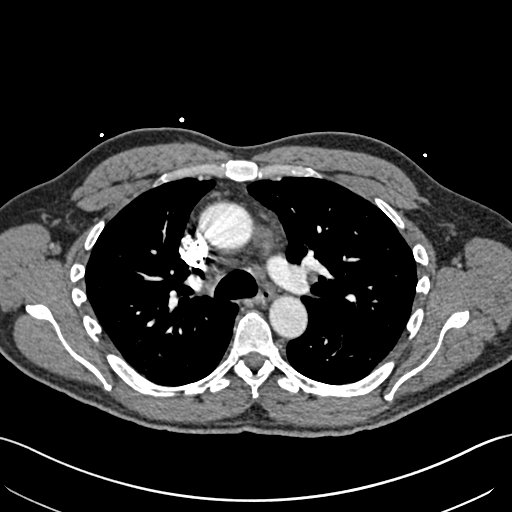
[im 213/306  lung]
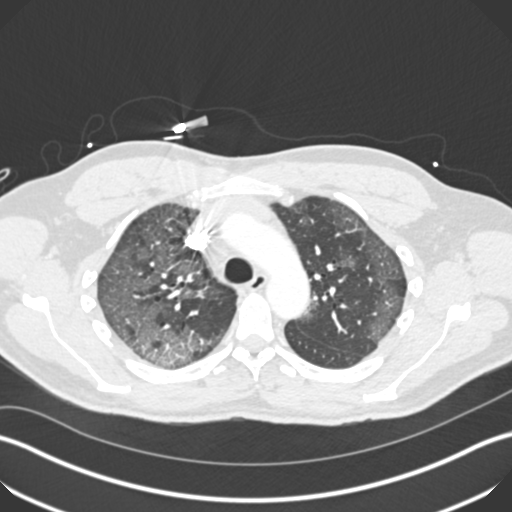
[im 226/306  soft-tissue]
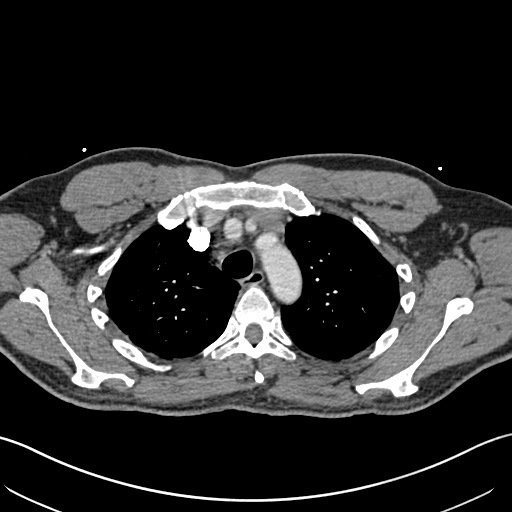
[im 252/306  lung]
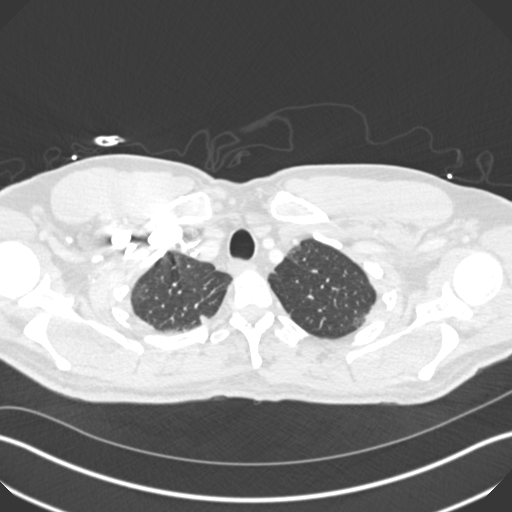
[im 266/306  soft-tissue]
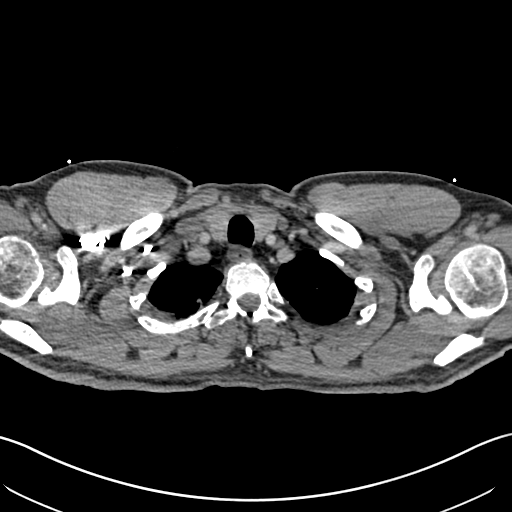
[im 292/306  lung]
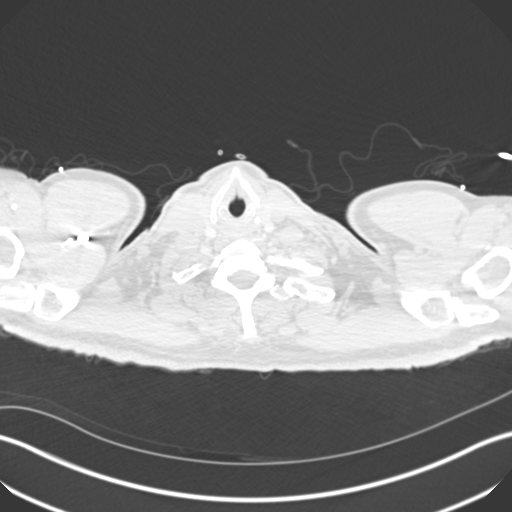

[Series 7: coronal mpr · coronal · 0.60mm/px · 3 of 84 slices shown]
[im 21/84  soft-tissue]
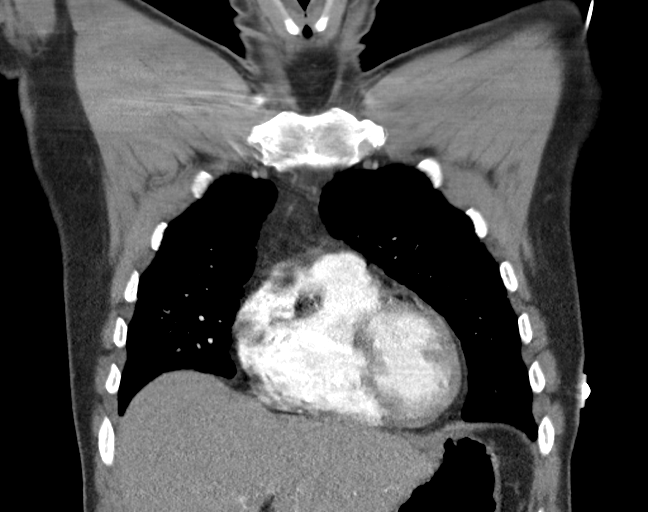
[im 42/84  soft-tissue]
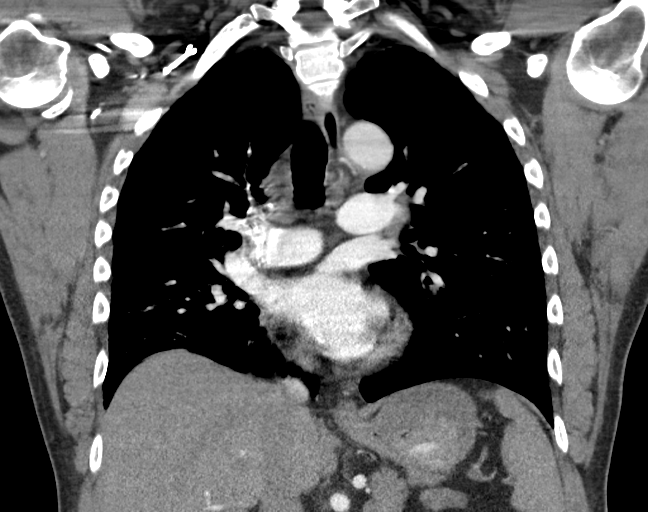
[im 63/84  soft-tissue]
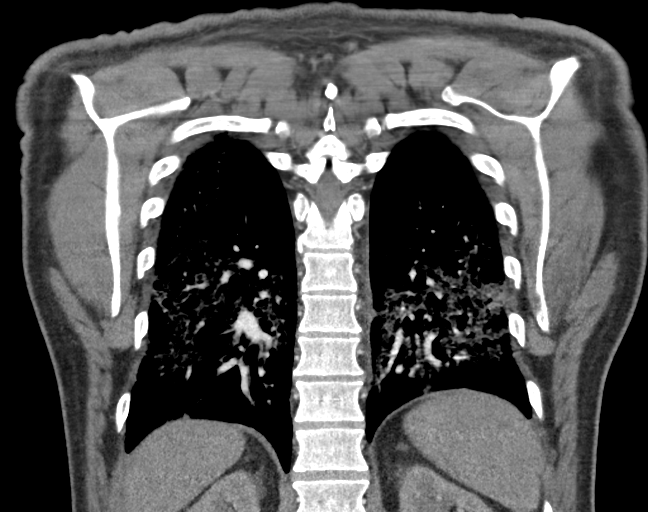

[18 of 46 positions shown; findings below may reference images not displayed]

FINDINGS: Cardiovascular: Satisfactory opacification of the pulmonary arteries
to the segmental level. No evidence of pulmonary embolism. Normal
heart size. No pericardial effusion. No thoracic aortic aneurysm or
dissection.

Mediastinum/Nodes: Enlarged mediastinal and bilateral hilar lymph
nodes measuring up to 1.7 cm. No enlarged axillary lymph nodes. The
thyroid gland, trachea, and esophagus demonstrate no significant
findings.

Lungs/Pleura: Diffuse patchy ground-glass density with inter- and
intralobular septal thickening throughout both lungs, with more
consolidative opacity in both lower lobes. No pleural effusion or
pneumothorax.

Upper Abdomen: No acute abnormality.

Musculoskeletal: No chest wall abnormality. No acute or significant
osseous findings.

Review of the MIP images confirms the above findings.
IMPRESSION: 1.  No evidence of pulmonary embolism.
2. Findings consistent with extensive multifocal pneumonia, with
appearance typical of 2AS7W-QP pneumonia.
3. Mediastinal and bilateral hilar lymphadenopathy, presumably
reactive, although this is uncommon in 2AS7W-QP infection. Consider
follow-up chest CT in 3 months to evaluate for interval change.

## 2023-10-06 ENCOUNTER — Ambulatory Visit
Admission: EM | Admit: 2023-10-06 | Discharge: 2023-10-06 | Disposition: A | Discharge status: Home / Self Care | Attending: Family Medicine | Admitting: Family Medicine

## 2023-10-06 ENCOUNTER — Encounter: Payer: Self-pay | Admitting: Emergency Medicine

## 2023-10-06 DIAGNOSIS — R0789 Other chest pain: Secondary | ICD-10-CM | POA: Insufficient documentation

## 2023-10-06 DIAGNOSIS — R002 Palpitations: Secondary | ICD-10-CM | POA: Insufficient documentation

## 2023-10-06 HISTORY — DX: Hyperlipidemia, unspecified: E78.5

## 2023-10-06 LAB — CBC WITH DIFFERENTIAL/PLATELET
Abs Immature Granulocytes: 0.02 10*3/uL (ref 0.00–0.07)
Basophils Absolute: 0 10*3/uL (ref 0.0–0.1)
Basophils Relative: 1 %
Eosinophils Absolute: 0.1 10*3/uL (ref 0.0–0.5)
Eosinophils Relative: 1 %
HCT: 44.4 % (ref 39.0–52.0)
Hemoglobin: 14.9 g/dL (ref 13.0–17.0)
Immature Granulocytes: 0 %
Lymphocytes Relative: 22 %
Lymphs Abs: 1.4 10*3/uL (ref 0.7–4.0)
MCH: 30.2 pg (ref 26.0–34.0)
MCHC: 33.6 g/dL (ref 30.0–36.0)
MCV: 90.1 fL (ref 80.0–100.0)
Monocytes Absolute: 0.5 10*3/uL (ref 0.1–1.0)
Monocytes Relative: 8 %
Neutro Abs: 4.2 10*3/uL (ref 1.7–7.7)
Neutrophils Relative %: 68 %
Platelets: 175 10*3/uL (ref 150–400)
RBC: 4.93 MIL/uL (ref 4.22–5.81)
RDW: 12.5 % (ref 11.5–15.5)
WBC: 6.1 10*3/uL (ref 4.0–10.5)
nRBC: 0 % (ref 0.0–0.2)

## 2023-10-06 LAB — COMPREHENSIVE METABOLIC PANEL WITH GFR
ALT: 23 U/L (ref 0–44)
AST: 27 U/L (ref 15–41)
Albumin: 4.7 g/dL (ref 3.5–5.0)
Alkaline Phosphatase: 54 U/L (ref 38–126)
Anion gap: 9 (ref 5–15)
BUN: 18 mg/dL (ref 8–23)
CO2: 26 mmol/L (ref 22–32)
Calcium: 9.3 mg/dL (ref 8.9–10.3)
Chloride: 102 mmol/L (ref 98–111)
Creatinine, Ser: 0.88 mg/dL (ref 0.61–1.24)
GFR, Estimated: >60 mL/min (ref >60)
Glucose, Bld: 92 mg/dL (ref 70–99)
Potassium: 4.1 mmol/L (ref 3.5–5.1)
Sodium: 137 mmol/L (ref 135–145)
Total Bilirubin: 1.1 mg/dL (ref 0.0–1.2)
Total Protein: 8.1 g/dL (ref 6.5–8.1)

## 2023-10-06 MED ORDER — METHOCARBAMOL 500 MG PO TABS: 500.0000 mg | ORAL_TABLET | Freq: Every evening | ORAL | 0 refills | Status: AC | PRN

## 2023-10-06 NOTE — ED Triage Notes (Signed)
 Patient presents with c/o palpitations that started today. Denies chest pain and dizziness.

## 2023-10-06 NOTE — ED Provider Notes (Signed)
 [facility]    CSN: [id number] Arrival date & time: 10/06/23  1535      History   Chief Complaint Chief Complaint  Patient presents with   Palpitations    HPI Chad Welch is a 63 y.o. male.   HPI  Chad Welch here for palpitations in the left side of his chest.  Wife reports that Chad Welch told her that he had the episodes more today than before.  No chest "pain", shortness of breath, nausea, vomiting, syncope, leg swelling, tingling in extremity, diaphoresis or jaw pain.   He has been eating and drinking well. Takes Crestor and multi-vitamins. No new medications or supplements.  Has been under more job stress recently.   Pt reports  history of PE, DVT, DM, HTN, cancer, recent surgery or recent travel   Tobacco use denied      Past Medical History:  Diagnosis Date   Hyperlipidemia     Patient Active Problem List   Diagnosis Date Noted   Pneumonia due to COVID-19 virus 05/10/2019   COVID-19 05/07/2019    Past Surgical History:  Procedure Laterality Date   NO PAST SURGERIES         Home Medications    Prior to Admission medications   Medication Sig Start Date End Date Taking? Authorizing Provider  methocarbamol  (ROBAXIN ) 500 MG tablet Take 1 tablet (500 mg total) by mouth at bedtime as needed for muscle spasms. 10/06/23  Yes Codi Kertz, DO  acetaminophen  (TYLENOL ) 500 MG tablet Take 500-1,000 mg by mouth every 6 (six) hours as needed for moderate pain or fever.     [provider]  rosuvastatin (CRESTOR) 10 MG tablet Take 10 mg by mouth at bedtime.    [provider]  fluticasone  (FLONASE ) 50 MCG/ACT nasal spray Place 2 sprays into both nostrils daily. 06/22/18 05/01/19  Ethlyn Herd, MD    Family History Family History  Problem Relation Age of Onset   Cancer Mother     Social History Social History   Tobacco Use   Smoking status: Never   Smokeless tobacco: Never  Vaping Use   Vaping status: Never Used  Substance Use  Topics   Alcohol use: Never   Drug use: Never     Allergies   Patient has no known allergies.   Review of Systems Review of Systems :negative unless otherwise stated in HPI.      Physical Exam Triage Vital Signs ED Triage Vitals  Encounter Vitals Group     BP 10/06/23 1543 136/80     Systolic BP Percentile --      Diastolic BP Percentile --      Pulse Rate 10/06/23 1543 76     Resp 10/06/23 1543 18     Temp 10/06/23 1543 97.8 F (36.6 C)     Temp Source 10/06/23 1543 Tympanic     SpO2 10/06/23 1543 97 %     Weight --      Height --      Head Circumference --      Peak Flow --      Pain Score 10/06/23 1545 0     Pain Loc --      Pain Education --      Exclude from Growth Chart --    No data found.  Updated Vital Signs BP 136/80 (BP Location: Left Arm)   Pulse 76   Temp 97.8 F (36.6 C) (Tympanic)   Resp 18   SpO2 97%  Visual Acuity Right Eye Distance:   Left Eye Distance:   Bilateral Distance:    Right Eye Near:   Left Eye Near:    Bilateral Near:     Physical Exam  GEN: pleasant well appearing male, in no acute distress  CV: regular rate and rhythm,  no murmurs, rubs or gallops  appreciated, no JVP  CHEST WALL: non-tender to palpation RESP: no increased work of breathing, clear to ascultation bilaterally MSK: no edema, or calf tenderness SKIN: warm, dry, no rash on visible skin NEURO: alert, moves all extremities appropriately PSYCH: Normal affect, appropriate speech and behavior   UC Treatments / Results  Labs (all labs ordered are listed, but only abnormal results are displayed) Labs Reviewed  COMPREHENSIVE METABOLIC PANEL WITH GFR  CBC WITH DIFFERENTIAL/PLATELET    EKG  See my EKG interpretation in the MDM section  Radiology No results found.   Procedures Procedures (including critical care time)  Medications Ordered in UC Medications - No data to display  Initial Impression / Assessment and Plan / UC Course  I have reviewed  the triage vital signs and the nursing notes.  Pertinent labs & imaging results that were available during my care of the patient were reviewed by me and considered in my medical decision making (see chart for details).       Patient is a 63 y.o. male with history of HLD  who presents with intermittent for "palpitations".  Vital signs stable.  He is afebrile and satting well on room air.    Differential diagnosis includes, but is not limited to, ACS, aortic dissection, pulmonary embolism, cardiac tamponade, pneumothorax, pneumonia, pericarditis/myocarditis, GI-related causes including esophagitis/gastritis, and musculoskeletal chest wall pain   Chad Welch has no history of PE.  Doubt PE but unable to [facility] out due to age.  EKG obtained and is without ST elevations or concern for ischemia, NSR; personally reviewed by me and no EKG available for comparison.  CBC unremarkable for anemia.  There were no electrolyte abnormalitis seen on CMP.  Chest x-ray deferred. No GERD symptoms. Not likely illicit drug induced. No other anxiety signs or symptoms. Exam not concerning for costochondritis.    We will trial a muscle relaxer as he does have history of tension headaches as well.  He does some strenuous work and this could be a pectoralis strain.  Methocarbamol  as needed at bedtime prescribed.  He is to follow-up with his primary care doctor for possible cardiology referral and possibly a TSH.  Strict ED precautions given and he voiced understanding.  Discussed MDM, treatment plan and plan for follow-up with patient and his wife who agree with plan.    Final Clinical Impressions(s) / UC Diagnoses   Final diagnoses:  Palpitations  Chest wall discomfort     Discharge Instructions      Your markers for infection and inflammation are normal.  Your electrolytes are normal.  Your kidney and liver function is normal.  Your EKG was normal as well.  I still do worry that you could have had a abnormal rhythm  and you should speak with your primary care doctor about seeing a cardiologist and wearing a heart monitor.  For now, lets treat you for possible muscle contractions with a muscle relaxer (methocarbamol /Robaxin ).  Do not drive operate heavy machinery while taking this medication.  Stop at the pharmacy to pick up your prescription.  Speak with your primary care doctor about not being able to sleep at night.   ED  Prescriptions     Medication Sig Dispense Auth. Provider   methocarbamol  (ROBAXIN ) 500 MG tablet Take 1 tablet (500 mg total) by mouth at bedtime as needed for muscle spasms. 30 tablet Calirose Mccance, DO      PDMP not reviewed this encounter.   Ysmael Hires, DO 10/07/23 1539

## 2023-10-06 NOTE — Discharge Instructions (Addendum)
 Your markers for infection and inflammation are normal.  Your electrolytes are normal.  Your kidney and liver function is normal.  Your EKG was normal as well.  I still do worry that you could have had a abnormal rhythm and you should speak with your primary care doctor about seeing a cardiologist and wearing a heart monitor.  For now, lets treat you for possible muscle contractions with a muscle relaxer (methocarbamol/Robaxin).  Do not drive operate heavy machinery while taking this medication.  Stop at the pharmacy to pick up your prescription.  Speak with your primary care doctor about not being able to sleep at night.
